# Patient Record
Sex: Male | Born: 1985 | Race: White | Hispanic: No | Marital: Married | Smoking: Never smoker
Health system: Southern US, Community
[De-identification: ages and names within clinical notes are randomized; demographics above are authoritative.]

## PROBLEM LIST (undated history)

## (undated) DIAGNOSIS — L219 Seborrheic dermatitis, unspecified: Secondary | ICD-10-CM

## (undated) DIAGNOSIS — Z91018 Allergy to other foods: Secondary | ICD-10-CM

## (undated) DIAGNOSIS — K219 Gastro-esophageal reflux disease without esophagitis: Secondary | ICD-10-CM

## (undated) DIAGNOSIS — I10 Essential (primary) hypertension: Secondary | ICD-10-CM

## (undated) DIAGNOSIS — F419 Anxiety disorder, unspecified: Secondary | ICD-10-CM

## (undated) DIAGNOSIS — M519 Unspecified thoracic, thoracolumbar and lumbosacral intervertebral disc disorder: Secondary | ICD-10-CM

## (undated) DIAGNOSIS — F329 Major depressive disorder, single episode, unspecified: Secondary | ICD-10-CM

## (undated) DIAGNOSIS — K5090 Crohn's disease, unspecified, without complications: Secondary | ICD-10-CM

## (undated) HISTORY — PX: BUNIONECTOMY: SHX129

## (undated) HISTORY — DX: Anxiety disorder, unspecified: F41.9

## (undated) HISTORY — PX: FOOT SURGERY: SHX648

---

## 2009-06-28 ENCOUNTER — Ambulatory Visit: Payer: Self-pay | Admitting: Internal Medicine

## 2009-07-09 ENCOUNTER — Ambulatory Visit: Payer: Self-pay | Admitting: Internal Medicine

## 2011-07-03 ENCOUNTER — Ambulatory Visit: Payer: Self-pay | Admitting: Internal Medicine

## 2012-04-17 ENCOUNTER — Emergency Department: Payer: Self-pay | Admitting: Emergency Medicine

## 2012-04-17 LAB — CBC WITH DIFFERENTIAL/PLATELET
Basophil %: 0.1 %
Eosinophil #: 0 10*3/uL (ref 0.0–0.7)
Eosinophil %: 0.5 %
HCT: 38.3 % — ABNORMAL LOW (ref 40.0–52.0)
HGB: 13.5 g/dL (ref 13.0–18.0)
Lymphocyte #: 3.4 10*3/uL (ref 1.0–3.6)
MCH: 32.7 pg (ref 26.0–34.0)
MCHC: 35.2 g/dL (ref 32.0–36.0)
MCV: 93 fL (ref 80–100)
Monocyte #: 0.6 x10 3/mm (ref 0.2–1.0)
Neutrophil #: 2.2 10*3/uL (ref 1.4–6.5)
Neutrophil %: 35.4 %
RBC: 4.12 10*6/uL — ABNORMAL LOW (ref 4.40–5.90)

## 2012-04-17 LAB — URINALYSIS, COMPLETE
Bacteria: NONE SEEN
Bilirubin,UR: NEGATIVE
Blood: NEGATIVE
Glucose,UR: NEGATIVE mg/dL (ref 0–75)
Ketone: NEGATIVE
Nitrite: NEGATIVE
Ph: 6 (ref 4.5–8.0)
RBC,UR: 3 /HPF (ref 0–5)
Squamous Epithelial: NONE SEEN
WBC UR: 3 /HPF (ref 0–5)

## 2012-04-17 LAB — COMPREHENSIVE METABOLIC PANEL
Anion Gap: 11 (ref 7–16)
BUN: 13 mg/dL (ref 7–18)
Bilirubin,Total: 0.4 mg/dL (ref 0.2–1.0)
Chloride: 106 mmol/L (ref 98–107)
Co2: 24 mmol/L (ref 21–32)
EGFR (African American): >60
EGFR (Non-African Amer.): >60
Potassium: 3.8 mmol/L (ref 3.5–5.1)
SGOT(AST): 24 U/L (ref 15–37)
Sodium: 141 mmol/L (ref 136–145)
Total Protein: 8.1 g/dL (ref 6.4–8.2)

## 2014-04-20 DIAGNOSIS — K5010 Crohn's disease of large intestine without complications: Secondary | ICD-10-CM | POA: Insufficient documentation

## 2014-07-14 ENCOUNTER — Ambulatory Visit: Payer: Self-pay | Admitting: Physician Assistant

## 2014-07-14 LAB — RAPID STREP-A WITH REFLX: MICRO TEXT REPORT: NEGATIVE

## 2014-07-17 LAB — BETA STREP CULTURE(ARMC)

## 2014-12-24 ENCOUNTER — Encounter: Payer: Self-pay | Admitting: Emergency Medicine

## 2014-12-24 ENCOUNTER — Emergency Department: Payer: BC Managed Care – PPO

## 2014-12-24 ENCOUNTER — Emergency Department
Admission: EM | Admit: 2014-12-24 | Discharge: 2014-12-24 | Disposition: A | Discharge status: Home / Self Care | Payer: BC Managed Care – PPO | Attending: Emergency Medicine | Admitting: Emergency Medicine

## 2014-12-24 DIAGNOSIS — R1084 Generalized abdominal pain: Secondary | ICD-10-CM | POA: Diagnosis not present

## 2014-12-24 DIAGNOSIS — R109 Unspecified abdominal pain: Secondary | ICD-10-CM | POA: Diagnosis present

## 2014-12-24 HISTORY — DX: Crohn's disease, unspecified, without complications: K50.90

## 2014-12-24 LAB — CBC WITH DIFFERENTIAL/PLATELET
Basophils Absolute: 0 10*3/uL (ref 0–0.1)
Basophils Relative: 0 %
Eosinophils Absolute: 0 10*3/uL (ref 0–0.7)
Eosinophils Relative: 0 %
HCT: 42.1 % (ref 40.0–52.0)
Hemoglobin: 14.1 g/dL (ref 13.0–18.0)
LYMPHS ABS: 1.2 10*3/uL (ref 1.0–3.6)
Lymphocytes Relative: 11 %
MCH: 31.3 pg (ref 26.0–34.0)
MCHC: 33.6 g/dL (ref 32.0–36.0)
MCV: 93.1 fL (ref 80.0–100.0)
MONOS PCT: 6 %
Monocytes Absolute: 0.6 10*3/uL (ref 0.2–1.0)
Neutro Abs: 9.5 10*3/uL — ABNORMAL HIGH (ref 1.4–6.5)
Neutrophils Relative %: 83 %
Platelets: 218 10*3/uL (ref 150–440)
RBC: 4.52 MIL/uL (ref 4.40–5.90)
RDW: 12.6 % (ref 11.5–14.5)
WBC: 11.4 10*3/uL — AB (ref 3.8–10.6)

## 2014-12-24 LAB — HEPATIC FUNCTION PANEL
ALK PHOS: 64 U/L (ref 38–126)
ALT: 38 U/L (ref 17–63)
AST: 33 U/L (ref 15–41)
Albumin: 5.1 g/dL — ABNORMAL HIGH (ref 3.5–5.0)
BILIRUBIN INDIRECT: 1 mg/dL — AB (ref 0.3–0.9)
BILIRUBIN TOTAL: 1.2 mg/dL (ref 0.3–1.2)
Bilirubin, Direct: 0.2 mg/dL (ref 0.1–0.5)
Total Protein: 8.6 g/dL — ABNORMAL HIGH (ref 6.5–8.1)

## 2014-12-24 LAB — BASIC METABOLIC PANEL
Anion gap: 11 (ref 5–15)
BUN: 19 mg/dL (ref 6–20)
CALCIUM: 9.8 mg/dL (ref 8.9–10.3)
CHLORIDE: 102 mmol/L (ref 101–111)
CO2: 22 mmol/L (ref 22–32)
Creatinine, Ser: 0.84 mg/dL (ref 0.61–1.24)
GFR calc Af Amer: >60 mL/min (ref >60)
GFR calc non Af Amer: >60 mL/min (ref >60)
Glucose, Bld: 122 mg/dL — ABNORMAL HIGH (ref 65–99)
Potassium: 3.9 mmol/L (ref 3.5–5.1)
SODIUM: 135 mmol/L (ref 135–145)

## 2014-12-24 LAB — LIPASE, BLOOD: LIPASE: 43 U/L (ref 22–51)

## 2014-12-24 MED ORDER — ONDANSETRON HCL 4 MG/2ML IJ SOLN
INTRAMUSCULAR | Status: AC
  Filled 2014-12-24: qty 2

## 2014-12-24 MED ORDER — ONDANSETRON 8 MG PO TBDP
ORAL_TABLET | ORAL | Status: AC
  Filled 2014-12-24: qty 1

## 2014-12-24 MED ORDER — ONDANSETRON HCL 4 MG/2ML IJ SOLN
4.0000 mg | Freq: Once | INTRAMUSCULAR | Status: AC
  Administered 2014-12-24: 4 mg via INTRAVENOUS

## 2014-12-24 MED ORDER — SODIUM CHLORIDE 0.9 % IV BOLUS (SEPSIS)
1000.0000 mL | Freq: Once | INTRAVENOUS | Status: AC
  Administered 2014-12-24: 1000 mL via INTRAVENOUS

## 2014-12-24 MED ORDER — ONDANSETRON 8 MG PO TBDP
8.0000 mg | ORAL_TABLET | ORAL | Status: AC
  Administered 2014-12-24: 8 mg via ORAL

## 2014-12-24 MED ORDER — HYDROMORPHONE HCL 1 MG/ML IJ SOLN
1.0000 mg | Freq: Once | INTRAMUSCULAR | Status: AC
  Administered 2014-12-24: 1 mg via INTRAVENOUS

## 2014-12-24 MED ORDER — ONDANSETRON HCL 4 MG/2ML IJ SOLN
INTRAMUSCULAR | Status: AC
  Administered 2014-12-24: 4 mg via INTRAVENOUS
  Filled 2014-12-24: qty 2

## 2014-12-24 MED ORDER — OXYCODONE-ACETAMINOPHEN 5-325 MG PO TABS: 1.0000 | ORAL_TABLET | Freq: Four times a day (QID) | ORAL | Status: DC | PRN

## 2014-12-24 MED ORDER — IOHEXOL 240 MG/ML SOLN
25.0000 mL | INTRAMUSCULAR | Status: AC
  Administered 2014-12-24: 25 mL via ORAL

## 2014-12-24 MED ORDER — HYDROMORPHONE HCL 1 MG/ML IJ SOLN
INTRAMUSCULAR | Status: AC
  Filled 2014-12-24: qty 1

## 2014-12-24 MED ORDER — IOHEXOL 300 MG/ML  SOLN
100.0000 mL | Freq: Once | INTRAMUSCULAR | Status: AC | PRN
  Administered 2014-12-24: 100 mL via INTRAVENOUS

## 2014-12-24 MED ORDER — IOHEXOL 240 MG/ML SOLN: 25.0000 mL | INTRAMUSCULAR | Status: DC

## 2014-12-24 MED ORDER — ONDANSETRON HCL 4 MG PO TABS: 4.0000 mg | ORAL_TABLET | Freq: Four times a day (QID) | ORAL | Status: DC | PRN

## 2014-12-24 NOTE — ED Provider Notes (Signed)
[facility] Emergency Department Provider Note  ____________________________________________  Time seen: 4:25 AM  I have reviewed the triage vital signs and the nursing notes.   HISTORY  Chief Complaint Abdominal Pain and Back Pain    HPI Cole Everett is a 29 y.o. male who reports. Umbilical and right lower quadrant pain for the past 4-6 hours. He has been gradually worsening and associated with lower back pain. He does have a history of Crohn's but states that this feels different. No history of kidney stones. No dysuria or hematuria. No fever or chills, but he is having nausea and had one episode of vomiting in the waiting room tonight. No prior surgeries. Pain is severe, radiating to the back, right lower quadrant, sharp, no aggravating or alleviating factors.     Past Medical History  Diagnosis Date  . Crohn disease     There are no active problems to display for this patient.   Past Surgical History  Procedure Laterality Date  . Foot surgery      Current Outpatient Rx  Name  Route  Sig  Dispense  Refill  . ibuprofen (ADVIL,MOTRIN) 800 MG tablet   Oral   Take 800 mg by mouth every 8 (eight) hours as needed for fever or mild pain.         Marland Kitchen ondansetron (ZOFRAN) 4 MG tablet   Oral   Take 1 tablet (4 mg total) by mouth every 6 (six) hours as needed for nausea or vomiting.   20 tablet   1   . oxyCODONE-acetaminophen (ROXICET) 5-325 MG per tablet   Oral   Take 1 tablet by mouth every 6 (six) hours as needed for severe pain.   12 tablet   0     Allergies Beef-derived products  History reviewed. No pertinent family history.  Social History History  Substance Use Topics  . Smoking status: Never Smoker   . Smokeless tobacco: Never Used  . Alcohol Use: Yes     Comment: 2-3 beer last night    Review of Systems  Constitutional: No fever or chills. No weight changes Eyes:No blurry vision or double vision.  ENT: No sore  throat. Cardiovascular: No chest pain. Respiratory: No dyspnea or cough. Gastrointestinal: Abdominal pain and vomiting as above.  No BRBPR or melena. Genitourinary: Negative for dysuria, urinary retention, bloody urine, or difficulty urinating. Musculoskeletal: Negative for back pain. No joint swelling or pain. Skin: Negative for rash. Neurological: Negative for headaches, focal weakness or numbness. Psychiatric:No anxiety or depression.   Endocrine:No hot/cold intolerance, changes in energy, or sleep difficulty.  10-point ROS otherwise negative.  ____________________________________________   PHYSICAL EXAM:  VITAL SIGNS: ED Triage Vitals  Enc Vitals Group     BP 12/24/14 0357 143/76 mmHg     Pulse Rate 12/24/14 0357 134     Resp 12/24/14 0357 20     Temp 12/24/14 0357 98.6 F (37 C)     Temp Source 12/24/14 0357 Oral     SpO2 12/24/14 0357 100 %     Weight 12/24/14 0357 185 lb (83.915 kg)     Height 12/24/14 0357  (1.778 m)     Head Cir --      Peak Flow --      Pain Score 12/24/14 0400 8     Pain Loc --      Pain Edu? --      Excl. in GC? --      Constitutional: Alert  and oriented. Well appearing mild distress  Eyes: No scleral icterus. No conjunctival pallor. PERRL. EOMI ENT   Head: Normocephalic and atraumatic.   Nose: No congestion/rhinnorhea. No septal hematoma   Mouth/Throat: MMM, no pharyngeal erythema. No peritonsillar mass. No uvula shift.   Neck: No stridor. No SubQ emphysema. No meningismus. Hematological/Lymphatic/Immunilogical: No cervical lymphadenopathy. Cardiovascular: Tachycardia rate 120. Normal and symmetric distal pulses are present in all extremities. No murmurs, rubs, or gallops. Respiratory: Normal respiratory effort without tachypnea nor retractions. Breath sounds are clear and equal bilaterally. No wheezes/rales/rhonchi. Gastrointestinal: Right lower quadrant and left lower quadrant tenderness.. No distention. There is no  CVA tenderness.  No rebound, rigidity, or guarding. Genitourinary: deferred Musculoskeletal: Nontender with normal range of motion in all extremities. No joint effusions.  No lower extremity tenderness.  No edema. Neurologic:   Normal speech and language.  CN 2-10 normal. Motor grossly intact. No pronator drift.  Normal gait. No gross focal neurologic deficits are appreciated.  Skin:  Skin is warm, dry and intact. No rash noted.  No petechiae, purpura, or bullae. Psychiatric: Mood and affect are normal. Speech and behavior are normal. Patient exhibits appropriate insight and judgment.  ____________________________________________    LABS (pertinent positives/negatives) (all labs ordered are listed, but only abnormal results are displayed) Labs Reviewed  BASIC METABOLIC PANEL - Abnormal; Notable for the following:    Glucose, Bld 122 (*)    All other components within normal limits  HEPATIC FUNCTION PANEL - Abnormal; Notable for the following:    Total Protein 8.6 (*)    Albumin 5.1 (*)    Indirect Bilirubin 1.0 (*)    All other components within normal limits  CBC WITH DIFFERENTIAL/PLATELET - Abnormal; Notable for the following:    WBC 11.4 (*)    Neutro Abs 9.5 (*)    All other components within normal limits  LIPASE, BLOOD  URINALYSIS COMPLETEWITH MICROSCOPIC ([facility] ONLY)   ____________________________________________   EKG    ____________________________________________    RADIOLOGY  CT abdomen and pelvis reviewed, unremarkable  ____________________________________________   PROCEDURES  ____________________________________________   INITIAL IMPRESSION / ASSESSMENT AND PLAN / ED COURSE  Pertinent labs & imaging results that were available during my care of the patient were reviewed by me and considered in my medical decision making (see chart for details).  Concern for appendicitis. IV fluids, IV Dilaudid and Zofran, labs, CT abdomen and  pelvis. ----------------------------------------- 7:10 AM on 12/24/2014 -----------------------------------------  Pain controlled, nausea improved with medication. Tolerating oral intake. Workup unremarkable. No appendicitis perforation kidney stone or Crohn's flare. We'll treat symptomatically with Zofran and Percocet when necessary and have him follow-up with his primary care doctor. ____________________________________________   FINAL CLINICAL IMPRESSION(S) / ED DIAGNOSES  Final diagnoses:  Generalized abdominal pain      Sharman CheekPhillip Deklen Popelka, MD 12/24/14 [phone]

## 2014-12-24 NOTE — Discharge Instructions (Signed)
Abdominal Pain °Many things can cause abdominal pain. Usually, abdominal pain is not caused by a disease and will improve without treatment. It can often be observed and treated at home. Your health care provider will do a physical exam and possibly order blood tests and X-rays to help determine the seriousness of your pain. However, in many cases, more time must pass before a clear cause of the pain can be found. Before that point, your health care provider may not know if you need more testing or further treatment. °HOME CARE INSTRUCTIONS  °Monitor your abdominal pain for any changes. The following actions may help to alleviate any discomfort you are experiencing: °· Only take over-the-counter or prescription medicines as directed by your health care provider. °· Do not take laxatives unless directed to do so by your health care provider. °· Try a clear liquid diet (broth, tea, or water) as directed by your health care provider. Slowly move to a bland diet as tolerated. °SEEK MEDICAL CARE IF: °· You have unexplained abdominal pain. °· You have abdominal pain associated with nausea or diarrhea. °· You have pain when you urinate or have a bowel movement. °· You experience abdominal pain that wakes you in the night. °· You have abdominal pain that is worsened or improved by eating food. °· You have abdominal pain that is worsened with eating fatty foods. °· You have a fever. °SEEK IMMEDIATE MEDICAL CARE IF:  °· Your pain does not go away within 2 hours. °· You keep throwing up (vomiting). °· Your pain is felt only in portions of the abdomen, such as the right side or the left lower portion of the abdomen. °· You pass bloody or black tarry stools. °MAKE SURE YOU: °· Understand these instructions.   °· Will watch your condition.   °· Will get help right away if you are not doing well or get worse.   °Document Released: 04/15/2005 Document Revised: 07/11/2013 Document Reviewed: 03/15/2013 °Exit[employer]® Patient Information  ©2015 [employer]. This information is not intended to replace advice given to you by your health care provider. Make sure you discuss any questions you have with your health care provider. ° °You were prescribed a medication that is potentially sedating. Do not drink alcohol, drive or participate in any other potentially dangerous activities while taking this medication as it may make you sleepy. Do not take this medication with any other sedating medications, either prescription or over-the-counter. If you were prescribed Percocet or Vicodin, do not take these with acetaminophen (Tylenol) as it is already contained within these medications. °  °Opioid pain medications (or "narcotics") can be habit forming.  Use it as little as possible to achieve adequate pain control.  Do not use or use it with extreme caution if you have a history of opiate abuse or dependence.  If you are on a pain contract with your primary care doctor or a pain specialist, be sure to let them know you were prescribed this medication today from the [facility].  This medication is intended for your use only - do not give any to anyone else and keep it in a secure place where nobody else, especially children and pets, have access to it.  It will also cause or worsen constipation, so you may want to consider taking an over-the-counter stool softener while you are taking this medication. ° °

## 2014-12-24 NOTE — ED Notes (Signed)
Pt c/o pain to umbilicus area since 2 am; burning, sharp pain; nausea, no vomiting; also having pain across lower back; pt with history of Crohns

## 2015-06-20 DIAGNOSIS — M519 Unspecified thoracic, thoracolumbar and lumbosacral intervertebral disc disorder: Secondary | ICD-10-CM | POA: Insufficient documentation

## 2015-06-20 DIAGNOSIS — K2100 Gastro-esophageal reflux disease with esophagitis, without bleeding: Secondary | ICD-10-CM | POA: Insufficient documentation

## 2016-08-08 ENCOUNTER — Encounter: Payer: Self-pay | Admitting: Gynecology

## 2016-08-08 ENCOUNTER — Ambulatory Visit
Admission: EM | Admit: 2016-08-08 | Discharge: 2016-08-08 | Disposition: A | Discharge status: Home / Self Care | Payer: BC Managed Care – PPO | Attending: Family Medicine | Admitting: Family Medicine

## 2016-08-08 DIAGNOSIS — K50911 Crohn's disease, unspecified, with rectal bleeding: Secondary | ICD-10-CM

## 2016-08-08 DIAGNOSIS — F3289 Other specified depressive episodes: Secondary | ICD-10-CM | POA: Diagnosis not present

## 2016-08-08 DIAGNOSIS — R1030 Lower abdominal pain, unspecified: Secondary | ICD-10-CM

## 2016-08-08 HISTORY — DX: Major depressive disorder, single episode, unspecified: F32.9

## 2016-08-08 HISTORY — DX: Unspecified thoracic, thoracolumbar and lumbosacral intervertebral disc disorder: M51.9

## 2016-08-08 HISTORY — DX: Gastro-esophageal reflux disease without esophagitis: K21.9

## 2016-08-08 HISTORY — DX: Essential (primary) hypertension: I10

## 2016-08-08 HISTORY — DX: Seborrheic dermatitis, unspecified: L21.9

## 2016-08-08 MED ORDER — PREDNISONE 10 MG (21) PO TBPK: 10.0000 mg | ORAL_TABLET | Freq: Every day | ORAL | 0 refills | Status: DC

## 2016-08-08 NOTE — ED Triage Notes (Signed)
Per patient chrons pain, stress and depression x few days.

## 2016-08-08 NOTE — ED Provider Notes (Signed)
CSN: [id number]     Arrival date & time 08/08/16  0844 History   First MD Initiated Contact with Patient 08/08/16 1041     Chief Complaint  Patient presents with  . Stress  . Depression   (Consider location/radiation/quality/duration/timing/severity/associated sxs/prior Treatment) HPI: 31 yo male with H/O Chron's, Anxiety, depression, HTN presented to clinic with CC of cramping, sharp abdominal pain on and off with 1-2 bloody Bm/day. Reports Anxiety/Depression medication is not working for him. Unable to fall asleep and focus. Increased stressor at work. Denies SI/HI.  Past Medical History:  Diagnosis Date  . Crohn disease (HCC)   . Depression   . GERD (gastroesophageal reflux disease)   . Hypertension   . Lumbar disc disease   . Seborrheic dermatitis    Past Surgical History:  Procedure Laterality Date  . BUNIONECTOMY    . FOOT SURGERY     No family history on file. Social History  Substance Use Topics  . Smoking status: Never Smoker  . Smokeless tobacco: Never Used  . Alcohol use Yes     Comment: 2-3 beer last night    Review of Systems  Gastrointestinal: Positive for abdominal pain and blood in stool.  Psychiatric/Behavioral: Positive for behavioral problems and sleep disturbance. Negative for hallucinations and suicidal ideas. The patient is nervous/anxious. The patient is not hyperactive.     Allergies  Beef-derived products and Penicillins  Home Medications   Prior to Admission medications   Medication Sig Start Date End Date Taking? Authorizing Provider  buPROPion (WELLBUTRIN XL) 150 MG 24 hr tablet Take 150 mg by mouth daily.   Yes Historical Provider, MD  cimetidine (TAGAMET) 400 MG tablet Take 400 mg by mouth 2 (two) times daily.   Yes Historical Provider, MD  hydrochlorothiazide (MICROZIDE) 12.5 MG capsule Take 12.5 mg by mouth daily.   Yes Historical Provider, MD  polyethylene glycol (MIRALAX / GLYCOLAX) packet Take 17 g by mouth daily.   Yes Historical  Provider, MD  ibuprofen (ADVIL,MOTRIN) 800 MG tablet Take 800 mg by mouth every 8 (eight) hours as needed for fever or mild pain.    Historical Provider, MD  ondansetron (ZOFRAN) 4 MG tablet Take 1 tablet (4 mg total) by mouth every 6 (six) hours as needed for nausea or vomiting. 12/24/14   Sharman Cheek, MD  oxyCODONE-acetaminophen (ROXICET) 5-325 MG per tablet Take 1 tablet by mouth every 6 (six) hours as needed for severe pain. 12/24/14   Sharman Cheek, MD  predniSONE (STERAPRED UNI-PAK 21 TAB) 10 MG (21) TBPK tablet Take 1 tablet (10 mg total) by mouth daily. Start 4 tabs PO day 1, then 3 tabs on day 2, then 2 tabs day 3, and 1 tab day 4 08/08/16   Jolana Runkles, NP   Meds Ordered and Administered this Visit  Medications - No data to display  BP (!) 149/85 (BP Location: Left Arm)   Pulse 97   Temp 98.1 F (36.7 C) (Oral)   Resp 16   Ht 5\' 10"  (1.778 m)   Wt 186 lb (84.4 kg)   SpO2 100%   BMI 26.69 kg/m  No data found.   Physical Exam  Constitutional: He is oriented to person, place, and time. He appears well-developed and well-nourished. No distress.  Cardiovascular: Normal rate and regular rhythm.   Pulmonary/Chest: Effort normal and breath sounds normal.  Abdominal: Soft. There is tenderness. There is no rebound and no guarding.  Neurological: He is alert and oriented to person, place,  and time.  Skin: Skin is warm.  Psychiatric: His behavior is normal. Judgment and thought content normal.  Pt is anxious, concerns for increased stressor at work. Denies SI/HI Lower abdominal tenderness to palpation. No guarding/rebound. Urgent Care Course     Procedures (including critical care time)  Labs Review Labs Reviewed - No data to display  Imaging Review No results found.   Visual Acuity Review  Right Eye Distance:   Left Eye Distance:   Bilateral Distance:    Right Eye Near:   Left Eye Near:    Bilateral Near:         MDM   1. Lower abdominal pain   2.  Exacerbation of Crohn's disease with rectal bleeding (HCC)   3. Other depression   Sx likely from Crohn's Exacerbation. Will put on tapering dose of prednisone. Continue Wellbutrin and other prescribed medications on regular basis.  Pt has appointment with Psychiatrist on Monday and with GI on Wednesday. Pt encouraged to keep both appointments for further evaluation and recommendations. If Sx escalates advised to go to ED or call 911.     Hosey Burmester, NP 08/08/16 1059    Monroe Toure, NP 08/08/16 1101

## 2018-09-21 DIAGNOSIS — M545 Low back pain: Secondary | ICD-10-CM | POA: Diagnosis not present

## 2018-12-15 ENCOUNTER — Ambulatory Visit
Admission: EM | Admit: 2018-12-15 | Discharge: 2018-12-15 | Disposition: A | Discharge status: Home / Self Care | Payer: BLUE CROSS/BLUE SHIELD

## 2018-12-15 ENCOUNTER — Other Ambulatory Visit: Payer: Self-pay

## 2018-12-15 DIAGNOSIS — R21 Rash and other nonspecific skin eruption: Secondary | ICD-10-CM

## 2018-12-15 DIAGNOSIS — W57XXXA Bitten or stung by nonvenomous insect and other nonvenomous arthropods, initial encounter: Secondary | ICD-10-CM

## 2018-12-15 HISTORY — DX: Allergy to other foods: Z91.018

## 2018-12-15 MED ORDER — DOXYCYCLINE HYCLATE 100 MG PO CAPS: 100.0000 mg | ORAL_CAPSULE | Freq: Two times a day (BID) | ORAL | 0 refills | Status: DC

## 2018-12-15 NOTE — Discharge Instructions (Signed)
It was very nice meeting you today in clinic. Thank you for entrusting me with your care.   As discussed, please utilize the medications that we discussed. Your prescriptions have been called in to your pharmacy. Monitor for increasing rash, redness, pain, swelling, drainage, and fever.   Make arrangements to follow up with your regular doctor in 1 week for re-evaluation. If your symptoms/condition worsens, please seek follow up care either here or in the ER. Please remember, our [facility] providers are "right here with you" when you need [place].   Again, it was my pleasure to take care of you today. Thank you for choosing our clinic. I hope that you start to feel better quickly.   Quentin Mulling, MSN, APRN, FNP-C, CEN Advanced Practice Provider [facility]

## 2018-12-15 NOTE — ED Triage Notes (Signed)
Patient complains of a tick bite to his left thigh. Patient states that he was bitten by a lonestar tick and has noticed swelling. 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 Patient states that swelling has been worsening.

## 2018-12-15 NOTE — ED Provider Notes (Signed)
[address] [place], [place] [address] [phone]   Name: Cole Everett DOB: 07-15-1986 MRN: [id number] CSN: [id number] PCP: Tommy Medal, MD  Arrival date and time:  12/15/18 1349  Chief Complaint:  Tick Removal  NOTE: Prior to seeing the patient today, I have reviewed the triage nursing documentation and vital signs. Clinical staff has updated patient's PMH/PSHx, current medication list, and drug allergies/intolerances to ensure comprehensive history available to assist in medical decision making.   History:   HPI: Cole Everett is a 33 y.o. male who presents today with complaints of multiple tick bites sustained on 12/10/2018.  Patient notes that he was outside over the weekend.  When he came into the shower, patient appreciated several embedded ticks to various parts of his body.  Patient with bites to LEFT shoulder, RIGHT axilla, upper abdomen, and LEFT inner thigh.  Bite to thigh with associated rash ascending into the groin.  Patient complains of inguinal lymphadenopathy and tenderness.  No fevers, headaches, myalgias, or arthralgias.  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  Past Medical History:  Diagnosis Date  . Allergy to alpha-gal   . Crohn disease ([place])   . Depression   . GERD (gastroesophageal reflux disease)   . Hypertension   . Lumbar disc disease   . Seborrheic dermatitis     Past Surgical History:  Procedure Laterality Date  . BUNIONECTOMY    . FOOT SURGERY      Family History  Problem Relation Age of Onset  . Irritable bowel syndrome Mother   . Hypertension Mother   . Heart disease Mother   . Cancer Father   . Cirrhosis Father     Social History   Socioeconomic History  . Marital status: Married    Spouse name: Not on file  . Number of children: Not on file  . Years of education: Not on file  . Highest education level: Not on file   Occupational History  . Not on file  Social Needs  . Financial resource strain: Not on file  . Food insecurity:    Worry: Not on file    Inability: Not on file  . Transportation needs:    Medical: Not on file    Non-medical: Not on file  Tobacco Use  . Smoking status: Never Smoker  . Smokeless tobacco: Never Used  Substance and Sexual Activity  . Alcohol use: Yes    Comment: occasionally  . Drug use: No  . Sexual activity: Not on file  Lifestyle  . Physical activity:    Days per week: Not on file    Minutes per session: Not on file  . Stress: Not on file  Relationships  . Social connections:    Talks on phone: Not on file    Gets together: Not on file    Attends religious service: Not on file    Active member of club or organization: Not on file    Attends meetings of clubs or organizations: Not on file    Relationship status: Not on file  . Intimate partner violence:    Fear of current or ex partner: Not on file    Emotionally abused: Not on file    Physically abused: Not on file    Forced sexual activity: Not on file  Other Topics Concern  . Not on file  Social History Narrative  . Not on  file    There are no active problems to display for this patient.   Home Medications:    Current Meds  Medication Sig  . CIMZIA PREFILLED 2 X 200 MG/ML KIT     Allergies:   Beef-derived products and Penicillins  Review of Systems (ROS): Review of Systems  Constitutional: Negative for chills and fever.  Respiratory: Negative for cough and shortness of breath.   Cardiovascular: Negative for chest pain and palpitations.  Gastrointestinal: Negative for nausea and vomiting.  Musculoskeletal: Negative for arthralgias, joint swelling and myalgias.  Skin:       Multiple tick bites  Neurological: Negative for dizziness and headaches.  Hematological: Positive for adenopathy (LEFT inguinal area).     Physical Exam:  Triage Vital Signs ED Triage Vitals  Enc Vitals Group      BP 12/15/18 1406 (!) 141/97     Pulse Rate 12/15/18 1406 94     Resp 12/15/18 1406 16     Temp 12/15/18 1406 98.4 F (36.9 C)     Temp Source 12/15/18 1406 Oral     SpO2 12/15/18 1406 99 %     Weight 12/15/18 1403 190 lb (86.2 kg)     Height 12/15/18 1403 5' 10"  (1.778 m)     Head Circumference --      Peak Flow --      Pain Score 12/15/18 1403 6     Pain Loc --      Pain Edu? --      Excl. in [place]? --     Physical Exam  Constitutional: He is oriented to person, place, and time and well-developed, well-nourished, and in no distress.  HENT:  Head: Normocephalic and atraumatic.  Mouth/Throat: Mucous membranes are normal.  Neck: Normal range of motion. Neck supple. No tracheal deviation present.  Cardiovascular: Normal rate, regular rhythm, normal heart sounds and intact distal pulses. Exam reveals no gallop and no friction rub.  No murmur heard. Pulmonary/Chest: Effort normal and breath sounds normal. No respiratory distress. He has no wheezes. He has no rales.  Lymphadenopathy:    He has no cervical adenopathy.       Left: Inguinal (tender LAD noted in line with lymphangitic spread) adenopathy present.  Neurological: He is alert and oriented to person, place, and time.  Skin: Skin is warm and dry.  Multiple tick bites: LEFT shoulder, RIGHT axilla, upper abdomen, and LEFT inner thigh.  (+) per-bite erythema notes with (+) lymphangitic spread into LEFT groin area.   Psychiatric: Mood, affect and judgment normal.  Nursing note and vitals reviewed.    Urgent Care Treatments / Results:   LABS: PLEASE NOTE: all labs that were ordered this encounter are listed, however only abnormal results are displayed. Labs Reviewed - No data to display  EKG: -None  RADIOLOGY: No results found.  PRODEDURES: Procedures  MEDICATIONS RECEIVED THIS VISIT: Medications - No data to display  PERTINENT CLINICAL COURSE NOTES/UPDATES: No data to display   Initial Impression / Assessment  and Plan / Urgent Care Course:    Cole Everett is a 33 y.o. male who presents to [facility] today with complaints of Tick Removal  Pertinent labs & imaging results that were available during my care of the patient were personally reviewed by me and considered in my medical decision making (see lab/imaging section of note for values and interpretations).  Exam reveals multiple insect bites.  Bite to LEFT thigh with associated a sending rash that terminates  in the groin area.  There is positive tender LAD noted.  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  Patient denies any associated fevers, headaches, arthralgias, or myalgias.  Patient notes that at least 1 of the tics was a [place] tick.  Discussed that Lonestar tick not generally associated with Lyme disease, however there are various tickborne illnesses such as ehrlichiosis that can result from tick bites.  Given patient's exam, number of bites, and past medical history, will proceed with prophylactic treatment with a 10-day course of doxycycline.  Patient encouraged to monitor symptoms.  Educated on concerning signs which would include increased redness, drainage, rash progression, and development of fever.  Patient may use APAP and/or ibuprofen as needed for conservative treatment.   Discussed follow up with primary care physician in 1 week for re-evaluation. I have reviewed the follow up and strict return precautions for any new or worsening symptoms. Patient is aware of symptoms that would be deemed urgent/emergent, and would thus require further evaluation either here or in the emergency department. At the time of discharge, he verbalized understanding and consent with the discharge plan as it was reviewed with him. All questions were fielded by provider and/or clinic staff prior to patient discharge.    Final Clinical Impressions(s) / Urgent Care Diagnoses:   Final diagnoses:  Tick bite, initial encounter    New  Prescriptions:   Meds ordered this encounter  Medications  . doxycycline (VIBRAMYCIN) 100 MG capsule    Sig: Take 1 capsule (100 mg total) by mouth 2 (two) times daily.    Dispense:  20 capsule    Refill:  0    Controlled Substance Prescriptions:  [place] Controlled Substance Registry consulted? Not Applicable  NOTE: This note was prepared using Dragon dictation software along with smaller phrase technology. Despite my best ability to proofread, there is the potential that transcriptional errors may still occur from this process, and are completely unintentional.     Karen Kitchens, NP 12/15/18 1435

## 2019-04-18 DIAGNOSIS — D225 Melanocytic nevi of trunk: Secondary | ICD-10-CM | POA: Diagnosis not present

## 2019-04-18 DIAGNOSIS — D485 Neoplasm of uncertain behavior of skin: Secondary | ICD-10-CM | POA: Diagnosis not present

## 2019-04-18 DIAGNOSIS — L400 Psoriasis vulgaris: Secondary | ICD-10-CM | POA: Diagnosis not present

## 2019-04-18 DIAGNOSIS — D2372 Other benign neoplasm of skin of left lower limb, including hip: Secondary | ICD-10-CM | POA: Diagnosis not present

## 2019-06-05 ENCOUNTER — Ambulatory Visit
Admission: EM | Admit: 2019-06-05 | Discharge: 2019-06-05 | Disposition: A | Discharge status: Home / Self Care | Payer: BC Managed Care – PPO | Attending: Family Medicine | Admitting: Family Medicine

## 2019-06-05 ENCOUNTER — Other Ambulatory Visit: Payer: Self-pay

## 2019-06-05 DIAGNOSIS — S39012A Strain of muscle, fascia and tendon of lower back, initial encounter: Secondary | ICD-10-CM | POA: Diagnosis not present

## 2019-06-05 DIAGNOSIS — R1031 Right lower quadrant pain: Secondary | ICD-10-CM

## 2019-06-05 DIAGNOSIS — Y999 Unspecified external cause status: Secondary | ICD-10-CM

## 2019-06-05 LAB — URINALYSIS, COMPLETE (UACMP) WITH MICROSCOPIC
Bacteria, UA: NONE SEEN
Bilirubin Urine: NEGATIVE
Glucose, UA: NEGATIVE mg/dL
Hgb urine dipstick: NEGATIVE
Ketones, ur: NEGATIVE mg/dL
Leukocytes,Ua: NEGATIVE
Nitrite: NEGATIVE
Protein, ur: NEGATIVE mg/dL
RBC / HPF: NONE SEEN RBC/hpf (ref 0–5)
Specific Gravity, Urine: 1.015 (ref 1.005–1.030)
Squamous Epithelial / LPF: NONE SEEN (ref 0–5)
WBC, UA: NONE SEEN WBC/hpf (ref 0–5)
pH: 7 (ref 5.0–8.0)

## 2019-06-05 MED ORDER — CYCLOBENZAPRINE HCL 10 MG PO TABS: 10.0000 mg | ORAL_TABLET | Freq: Every day | ORAL | 0 refills | Status: DC

## 2019-06-05 NOTE — Discharge Instructions (Signed)
Rest, heat, easy low back stretches, over the counter ibuprofen and tylenol as needed

## 2019-06-05 NOTE — ED Triage Notes (Signed)
10 days of right flank pain now going into right groin. Pain 6/10. No nausea, no trouble urinating

## 2019-06-05 NOTE — ED Provider Notes (Signed)
[facility]    CSN: [id number] Arrival date & time: 06/05/19  1432      History   Chief Complaint Chief Complaint  Patient presents with  . Flank Pain    HPI Cole Everett is a 33 y.o. male.   33 yo male with a c/o right lower back pain for 10 days. Denies any falls or traumatic injury, fevers, chills, numbness/tingling, nausea, vomiting, diarrhea, urinary symptoms. States yesterday he felt the pain radiating down towards the right groin area. States he has had some constipation. States low back pain is worse with certain positions or movements and relieved last night with pillow lumbar support.    Flank Pain    Past Medical History:  Diagnosis Date  . Allergy to alpha-gal   . Crohn disease ([place])   . Depression   . GERD (gastroesophageal reflux disease)   . Hypertension   . Lumbar disc disease   . Seborrheic dermatitis     There are no active problems to display for this patient.   Past Surgical History:  Procedure Laterality Date  . BUNIONECTOMY    . FOOT SURGERY         Home Medications    Prior to Admission medications   Medication Sig Start Date End Date Taking? Authorizing Provider  CIMZIA PREFILLED 2 X 200 MG/ML KIT  10/03/18   [provider]  cyclobenzaprine (FLEXERIL) 10 MG tablet Take 1 tablet (10 mg total) by mouth at bedtime. 06/05/19   Norval Gable, MD  doxycycline (VIBRAMYCIN) 100 MG capsule Take 1 capsule (100 mg total) by mouth 2 (two) times daily. 12/15/18   Karen Kitchens, NP    Family History Family History  Problem Relation Age of Onset  . Irritable bowel syndrome Mother   . Hypertension Mother   . Heart disease Mother   . Cancer Father   . Cirrhosis Father     Social History Social History   Tobacco Use  . Smoking status: Never Smoker  . Smokeless tobacco: Never Used  Substance Use Topics  . Alcohol use: Yes    Comment: occasionally  . Drug use: Yes    Types: Marijuana    Comment: last use last  night     Allergies   Beef-derived products and Penicillins   Review of Systems Review of Systems  Genitourinary: Positive for flank pain.     Physical Exam Triage Vital Signs ED Triage Vitals  Enc Vitals Group     BP 06/05/19 1446 (!) 151/91     Pulse Rate 06/05/19 1446 82     Resp 06/05/19 1446 16     Temp 06/05/19 1446 98.3 F (36.8 C)     Temp Source 06/05/19 1446 Oral     SpO2 06/05/19 1446 100 %     Weight 06/05/19 1447 195 lb (88.5 kg)     Height 06/05/19 1447 _0  (1.778 m)     Head Circumference --      Peak Flow --      Pain Score 06/05/19 1446 6     Pain Loc --      Pain Edu? --      Excl. in [place]? --    No data found.  Updated Vital Signs BP (!) 151/91 (BP Location: Right Arm)   Pulse 82   Temp 98.3 F (36.8 C) (Oral)   Resp 16   Ht _1  (1.778 m)   Wt 88.5 kg   SpO2 100%  BMI 27.98 kg/m   Visual Acuity Right Eye Distance:   Left Eye Distance:   Bilateral Distance:    Right Eye Near:   Left Eye Near:    Bilateral Near:     Physical Exam Vitals signs and nursing note reviewed.  Constitutional:      General: He is not in acute distress.    Appearance: He is not toxic-appearing or diaphoretic.  Cardiovascular:     Rate and Rhythm: Normal rate.  Pulmonary:     Effort: Pulmonary effort is normal. No respiratory distress.  Abdominal:     General: Bowel sounds are normal. There is no distension.     Palpations: Abdomen is soft. There is no mass.     Tenderness: There is no abdominal tenderness. There is no right CVA tenderness, left CVA tenderness, guarding or rebound.     Hernia: No hernia is present. There is no hernia in the right inguinal area.  Genitourinary:    Pubic Area: No rash.      Penis: Normal.      Scrotum/Testes: Normal.     Epididymis:     Right: Normal.  Musculoskeletal:     Lumbar back: He exhibits tenderness (over the right lower paraspinous muscles) and spasm. He exhibits no bony tenderness, no swelling, no  edema, no deformity, no laceration and normal pulse.  Lymphadenopathy:     Lower Body: No right inguinal adenopathy.  Neurological:     Mental Status: He is alert.      UC Treatments / Results  Labs (all labs ordered are listed, but only abnormal results are displayed) Labs Reviewed  URINALYSIS, COMPLETE (UACMP) WITH MICROSCOPIC    EKG   Radiology No results found.  Procedures Procedures (including critical care time)  Medications Ordered in UC Medications - No data to display  Initial Impression / Assessment and Plan / UC Course  I have reviewed the triage vital signs and the nursing notes.  Pertinent labs & imaging results that were available during my care of the patient were reviewed by me and considered in my medical decision making (see chart for details).      Final Clinical Impressions(s) / UC Diagnoses   Final diagnoses:  Strain of lumbar region, initial encounter     Discharge Instructions     Rest, heat, easy low back stretches, over the counter ibuprofen and tylenol as needed    ED Prescriptions    Medication Sig Dispense Auth. Provider   cyclobenzaprine (FLEXERIL) 10 MG tablet Take 1 tablet (10 mg total) by mouth at bedtime. 15 tablet Norval Gable, MD      1. Lab results and diagnosis reviewed with patient 2. rx as per orders above; reviewed possible side effects, interactions, risks and benefits  3. Recommend supportive treatment as above 4. Follow-up prn if symptoms worsen or don't improve  PDMP not reviewed this encounter.   Norval Gable, MD 06/05/19 [phone]

## 2019-07-06 ENCOUNTER — Other Ambulatory Visit: Payer: Self-pay | Admitting: General Practice

## 2019-07-06 ENCOUNTER — Ambulatory Visit
Admission: RE | Admit: 2019-07-06 | Discharge: 2019-07-06 | Disposition: A | Discharge status: Home / Self Care | Payer: BC Managed Care – PPO | Source: Ambulatory Visit | Attending: General Practice | Admitting: General Practice

## 2019-07-06 DIAGNOSIS — M546 Pain in thoracic spine: Secondary | ICD-10-CM

## 2019-07-06 DIAGNOSIS — M545 Low back pain, unspecified: Secondary | ICD-10-CM

## 2019-07-24 DIAGNOSIS — M5416 Radiculopathy, lumbar region: Secondary | ICD-10-CM | POA: Diagnosis not present

## 2019-07-28 DIAGNOSIS — M546 Pain in thoracic spine: Secondary | ICD-10-CM | POA: Diagnosis not present

## 2019-07-28 DIAGNOSIS — M545 Low back pain: Secondary | ICD-10-CM | POA: Diagnosis not present

## 2019-08-02 DIAGNOSIS — M545 Low back pain: Secondary | ICD-10-CM | POA: Diagnosis not present

## 2019-08-02 DIAGNOSIS — M546 Pain in thoracic spine: Secondary | ICD-10-CM | POA: Diagnosis not present

## 2019-08-04 DIAGNOSIS — M546 Pain in thoracic spine: Secondary | ICD-10-CM | POA: Diagnosis not present

## 2019-08-04 DIAGNOSIS — M545 Low back pain: Secondary | ICD-10-CM | POA: Diagnosis not present

## 2019-08-06 DIAGNOSIS — Z20822 Contact with and (suspected) exposure to covid-19: Secondary | ICD-10-CM | POA: Diagnosis not present

## 2019-08-06 DIAGNOSIS — Z01812 Encounter for preprocedural laboratory examination: Secondary | ICD-10-CM | POA: Diagnosis not present

## 2019-08-07 DIAGNOSIS — M545 Low back pain: Secondary | ICD-10-CM | POA: Diagnosis not present

## 2019-08-07 DIAGNOSIS — M546 Pain in thoracic spine: Secondary | ICD-10-CM | POA: Diagnosis not present

## 2019-08-09 DIAGNOSIS — K50919 Crohn's disease, unspecified, with unspecified complications: Secondary | ICD-10-CM | POA: Diagnosis not present

## 2019-08-09 DIAGNOSIS — K5010 Crohn's disease of large intestine without complications: Secondary | ICD-10-CM | POA: Diagnosis not present

## 2019-08-09 DIAGNOSIS — K648 Other hemorrhoids: Secondary | ICD-10-CM | POA: Diagnosis not present

## 2019-08-11 DIAGNOSIS — M546 Pain in thoracic spine: Secondary | ICD-10-CM | POA: Diagnosis not present

## 2019-08-11 DIAGNOSIS — M545 Low back pain: Secondary | ICD-10-CM | POA: Diagnosis not present

## 2019-08-16 DIAGNOSIS — M546 Pain in thoracic spine: Secondary | ICD-10-CM | POA: Diagnosis not present

## 2019-08-16 DIAGNOSIS — M545 Low back pain: Secondary | ICD-10-CM | POA: Diagnosis not present

## 2019-08-24 DIAGNOSIS — M546 Pain in thoracic spine: Secondary | ICD-10-CM | POA: Diagnosis not present

## 2019-08-24 DIAGNOSIS — M545 Low back pain: Secondary | ICD-10-CM | POA: Diagnosis not present

## 2019-08-28 DIAGNOSIS — M546 Pain in thoracic spine: Secondary | ICD-10-CM | POA: Diagnosis not present

## 2019-08-28 DIAGNOSIS — M545 Low back pain: Secondary | ICD-10-CM | POA: Diagnosis not present

## 2019-09-06 DIAGNOSIS — M545 Low back pain: Secondary | ICD-10-CM | POA: Diagnosis not present

## 2019-09-06 DIAGNOSIS — M546 Pain in thoracic spine: Secondary | ICD-10-CM | POA: Diagnosis not present

## 2019-09-14 DIAGNOSIS — M5416 Radiculopathy, lumbar region: Secondary | ICD-10-CM | POA: Diagnosis not present

## 2019-09-18 DIAGNOSIS — M545 Low back pain: Secondary | ICD-10-CM | POA: Diagnosis not present

## 2019-09-18 DIAGNOSIS — M546 Pain in thoracic spine: Secondary | ICD-10-CM | POA: Diagnosis not present

## 2020-01-19 DIAGNOSIS — F331 Major depressive disorder, recurrent, moderate: Secondary | ICD-10-CM | POA: Diagnosis not present

## 2020-01-19 DIAGNOSIS — F419 Anxiety disorder, unspecified: Secondary | ICD-10-CM | POA: Diagnosis not present

## 2020-01-31 DIAGNOSIS — F4310 Post-traumatic stress disorder, unspecified: Secondary | ICD-10-CM | POA: Diagnosis not present

## 2020-02-12 DIAGNOSIS — F419 Anxiety disorder, unspecified: Secondary | ICD-10-CM | POA: Diagnosis not present

## 2020-02-12 DIAGNOSIS — F331 Major depressive disorder, recurrent, moderate: Secondary | ICD-10-CM | POA: Diagnosis not present

## 2020-02-14 DIAGNOSIS — F4310 Post-traumatic stress disorder, unspecified: Secondary | ICD-10-CM | POA: Diagnosis not present

## 2020-03-19 DIAGNOSIS — F4310 Post-traumatic stress disorder, unspecified: Secondary | ICD-10-CM | POA: Diagnosis not present

## 2020-04-01 DIAGNOSIS — F4310 Post-traumatic stress disorder, unspecified: Secondary | ICD-10-CM | POA: Diagnosis not present

## 2020-04-03 DIAGNOSIS — S93402A Sprain of unspecified ligament of left ankle, initial encounter: Secondary | ICD-10-CM | POA: Diagnosis not present

## 2020-04-08 DIAGNOSIS — F4310 Post-traumatic stress disorder, unspecified: Secondary | ICD-10-CM | POA: Diagnosis not present

## 2020-04-09 DIAGNOSIS — F4310 Post-traumatic stress disorder, unspecified: Secondary | ICD-10-CM | POA: Diagnosis not present

## 2020-04-09 DIAGNOSIS — F331 Major depressive disorder, recurrent, moderate: Secondary | ICD-10-CM | POA: Diagnosis not present

## 2020-04-09 DIAGNOSIS — F419 Anxiety disorder, unspecified: Secondary | ICD-10-CM | POA: Diagnosis not present

## 2020-04-15 DIAGNOSIS — F4310 Post-traumatic stress disorder, unspecified: Secondary | ICD-10-CM | POA: Diagnosis not present

## 2020-04-15 DIAGNOSIS — K50919 Crohn's disease, unspecified, with unspecified complications: Secondary | ICD-10-CM | POA: Diagnosis not present

## 2020-04-16 DIAGNOSIS — D2261 Melanocytic nevi of right upper limb, including shoulder: Secondary | ICD-10-CM | POA: Diagnosis not present

## 2020-04-16 DIAGNOSIS — D2262 Melanocytic nevi of left upper limb, including shoulder: Secondary | ICD-10-CM | POA: Diagnosis not present

## 2020-04-16 DIAGNOSIS — D225 Melanocytic nevi of trunk: Secondary | ICD-10-CM | POA: Diagnosis not present

## 2020-04-16 DIAGNOSIS — L400 Psoriasis vulgaris: Secondary | ICD-10-CM | POA: Diagnosis not present

## 2020-05-02 DIAGNOSIS — F4310 Post-traumatic stress disorder, unspecified: Secondary | ICD-10-CM | POA: Diagnosis not present

## 2020-05-07 DIAGNOSIS — F4310 Post-traumatic stress disorder, unspecified: Secondary | ICD-10-CM | POA: Diagnosis not present

## 2020-05-16 DIAGNOSIS — M5137 Other intervertebral disc degeneration, lumbosacral region: Secondary | ICD-10-CM | POA: Diagnosis not present

## 2020-05-16 DIAGNOSIS — M5136 Other intervertebral disc degeneration, lumbar region: Secondary | ICD-10-CM | POA: Diagnosis not present

## 2020-05-21 DIAGNOSIS — M5136 Other intervertebral disc degeneration, lumbar region: Secondary | ICD-10-CM | POA: Diagnosis not present

## 2020-05-23 DIAGNOSIS — F4310 Post-traumatic stress disorder, unspecified: Secondary | ICD-10-CM | POA: Diagnosis not present

## 2020-05-28 DIAGNOSIS — F4310 Post-traumatic stress disorder, unspecified: Secondary | ICD-10-CM | POA: Diagnosis not present

## 2020-05-29 DIAGNOSIS — F4310 Post-traumatic stress disorder, unspecified: Secondary | ICD-10-CM | POA: Diagnosis not present

## 2020-06-04 DIAGNOSIS — F4310 Post-traumatic stress disorder, unspecified: Secondary | ICD-10-CM | POA: Diagnosis not present

## 2020-06-06 DIAGNOSIS — F4310 Post-traumatic stress disorder, unspecified: Secondary | ICD-10-CM | POA: Diagnosis not present

## 2020-06-11 DIAGNOSIS — F4310 Post-traumatic stress disorder, unspecified: Secondary | ICD-10-CM | POA: Diagnosis not present

## 2020-06-18 DIAGNOSIS — F4310 Post-traumatic stress disorder, unspecified: Secondary | ICD-10-CM | POA: Diagnosis not present

## 2020-06-20 DIAGNOSIS — G8929 Other chronic pain: Secondary | ICD-10-CM | POA: Diagnosis not present

## 2020-06-20 DIAGNOSIS — M545 Low back pain, unspecified: Secondary | ICD-10-CM | POA: Diagnosis not present

## 2020-06-21 DIAGNOSIS — F4310 Post-traumatic stress disorder, unspecified: Secondary | ICD-10-CM | POA: Diagnosis not present

## 2020-06-25 DIAGNOSIS — F4310 Post-traumatic stress disorder, unspecified: Secondary | ICD-10-CM | POA: Diagnosis not present

## 2020-07-02 DIAGNOSIS — F4310 Post-traumatic stress disorder, unspecified: Secondary | ICD-10-CM | POA: Diagnosis not present

## 2020-07-04 DIAGNOSIS — F4310 Post-traumatic stress disorder, unspecified: Secondary | ICD-10-CM | POA: Diagnosis not present

## 2020-07-08 DIAGNOSIS — M5441 Lumbago with sciatica, right side: Secondary | ICD-10-CM | POA: Diagnosis not present

## 2020-07-08 DIAGNOSIS — G8929 Other chronic pain: Secondary | ICD-10-CM | POA: Diagnosis not present

## 2020-07-08 DIAGNOSIS — M48061 Spinal stenosis, lumbar region without neurogenic claudication: Secondary | ICD-10-CM | POA: Diagnosis not present

## 2020-07-09 DIAGNOSIS — F4310 Post-traumatic stress disorder, unspecified: Secondary | ICD-10-CM | POA: Diagnosis not present

## 2020-07-10 DIAGNOSIS — F4310 Post-traumatic stress disorder, unspecified: Secondary | ICD-10-CM | POA: Diagnosis not present

## 2020-07-24 DIAGNOSIS — F331 Major depressive disorder, recurrent, moderate: Secondary | ICD-10-CM | POA: Diagnosis not present

## 2020-07-24 DIAGNOSIS — F633 Trichotillomania: Secondary | ICD-10-CM | POA: Diagnosis not present

## 2020-07-24 DIAGNOSIS — F419 Anxiety disorder, unspecified: Secondary | ICD-10-CM | POA: Diagnosis not present

## 2020-07-25 DIAGNOSIS — M5441 Lumbago with sciatica, right side: Secondary | ICD-10-CM | POA: Diagnosis not present

## 2020-07-25 DIAGNOSIS — M48061 Spinal stenosis, lumbar region without neurogenic claudication: Secondary | ICD-10-CM | POA: Diagnosis not present

## 2020-07-30 DIAGNOSIS — F4310 Post-traumatic stress disorder, unspecified: Secondary | ICD-10-CM | POA: Diagnosis not present

## 2020-08-01 DIAGNOSIS — F4310 Post-traumatic stress disorder, unspecified: Secondary | ICD-10-CM | POA: Diagnosis not present

## 2020-08-06 DIAGNOSIS — F4310 Post-traumatic stress disorder, unspecified: Secondary | ICD-10-CM | POA: Diagnosis not present

## 2020-08-13 ENCOUNTER — Other Ambulatory Visit: Payer: Self-pay | Admitting: Family Medicine

## 2020-08-13 ENCOUNTER — Ambulatory Visit (INDEPENDENT_AMBULATORY_CARE_PROVIDER_SITE_OTHER): Payer: BC Managed Care – PPO | Admitting: Family Medicine

## 2020-08-13 ENCOUNTER — Encounter: Payer: Self-pay | Admitting: Family Medicine

## 2020-08-13 ENCOUNTER — Other Ambulatory Visit: Payer: Self-pay

## 2020-08-13 VITALS — BP 126/79 | HR 70 | Ht 70.0 in | Wt 167.8 lb

## 2020-08-13 DIAGNOSIS — M5441 Lumbago with sciatica, right side: Secondary | ICD-10-CM

## 2020-08-13 DIAGNOSIS — M5136 Other intervertebral disc degeneration, lumbar region: Secondary | ICD-10-CM | POA: Diagnosis not present

## 2020-08-13 DIAGNOSIS — F331 Major depressive disorder, recurrent, moderate: Secondary | ICD-10-CM

## 2020-08-13 DIAGNOSIS — Z13220 Encounter for screening for lipoid disorders: Secondary | ICD-10-CM

## 2020-08-13 DIAGNOSIS — Z1159 Encounter for screening for other viral diseases: Secondary | ICD-10-CM

## 2020-08-13 DIAGNOSIS — M51369 Other intervertebral disc degeneration, lumbar region without mention of lumbar back pain or lower extremity pain: Secondary | ICD-10-CM

## 2020-08-13 DIAGNOSIS — K5010 Crohn's disease of large intestine without complications: Secondary | ICD-10-CM

## 2020-08-13 DIAGNOSIS — F410 Panic disorder [episodic paroxysmal anxiety] without agoraphobia: Secondary | ICD-10-CM

## 2020-08-13 DIAGNOSIS — F411 Generalized anxiety disorder: Secondary | ICD-10-CM | POA: Diagnosis not present

## 2020-08-13 DIAGNOSIS — Z0000 Encounter for general adult medical examination without abnormal findings: Secondary | ICD-10-CM

## 2020-08-13 DIAGNOSIS — G8929 Other chronic pain: Secondary | ICD-10-CM

## 2020-08-13 DIAGNOSIS — R7309 Other abnormal glucose: Secondary | ICD-10-CM

## 2020-08-13 DIAGNOSIS — Z114 Encounter for screening for human immunodeficiency virus [HIV]: Secondary | ICD-10-CM
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
## 2020-08-13 NOTE — Assessment & Plan Note (Signed)
Chronic low back pain, R>L R sided sciatica history Followed by [facility] Neurosurgery specialist Past ESI Lumbar therapy, has DDD Lumbar L5 region Controlled currently - with multiple therapy options in past PT, Chiropractor

## 2020-08-13 NOTE — Assessment & Plan Note (Addendum)
Currently controlled Managed by Psychiatry Carleene Mains NP, [facility]) On med management with Wellbutrin, Clonidine, Prozac, therapy

## 2020-08-13 NOTE — Patient Instructions (Addendum)
Thank you for coming to the office today.   DUE for FASTING BLOOD WORK (no food or drink after midnight before the lab appointment, only water or coffee without cream/sugar on the morning of)  SCHEDULE "Lab Only" visit in the morning at the clinic for lab draw in 1-3 MONTHS   - Make sure Lab Only appointment is at about 1 week before your next appointment, so that results will be available  For Lab Results, once available within 2-3 days of blood draw, you can can log in to MyChart online to view your results and a brief explanation. Also, we can discuss results at next follow-up visit.    Please schedule a Follow-up Appointment to: Return in about 6 weeks (around 09/24/2020) for 6 week fasting lab only then 1 week later Annual Physical.  If you have any other questions or concerns, please feel free to call the office or send a message through MyChart. You may also schedule an earlier appointment if necessary.  Additionally, you may be receiving a survey about your experience at our office within a few days to 1 week by e-mail or mail. We value your feedback.  Saralyn Pilar, DO [facility], [place]

## 2020-08-13 NOTE — Assessment & Plan Note (Signed)
Chronic Crohn's disease managed by GI specialist (Dr Barbie Banner, [facility]) Prior Colonoscopy 2009 initial dx, and 07/2019 On Cimzea therapy

## 2020-08-13 NOTE — Assessment & Plan Note (Signed)
Chronic low back pain, R>L R sided sciatica history Followed by [facility] specialist Past ESI Lumbar therapy, has DDD Lumbar L5 region Controlled currently - with multiple therapy options in past PT, Chiropractor 

## 2020-08-15 DIAGNOSIS — F4310 Post-traumatic stress disorder, unspecified: Secondary | ICD-10-CM | POA: Diagnosis not present

## 2020-08-20 DIAGNOSIS — F4310 Post-traumatic stress disorder, unspecified: Secondary | ICD-10-CM | POA: Diagnosis not present

## 2020-08-26 DIAGNOSIS — G8929 Other chronic pain: Secondary | ICD-10-CM | POA: Diagnosis not present

## 2020-08-26 DIAGNOSIS — M5441 Lumbago with sciatica, right side: Secondary | ICD-10-CM | POA: Diagnosis not present

## 2020-08-26 DIAGNOSIS — M48061 Spinal stenosis, lumbar region without neurogenic claudication: Secondary | ICD-10-CM | POA: Diagnosis not present

## 2020-08-27 DIAGNOSIS — F4310 Post-traumatic stress disorder, unspecified: Secondary | ICD-10-CM | POA: Diagnosis not present

## 2020-09-03 DIAGNOSIS — F4310 Post-traumatic stress disorder, unspecified: Secondary | ICD-10-CM | POA: Diagnosis not present

## 2020-09-04 DIAGNOSIS — F331 Major depressive disorder, recurrent, moderate: Secondary | ICD-10-CM | POA: Diagnosis not present

## 2020-09-04 DIAGNOSIS — F633 Trichotillomania: Secondary | ICD-10-CM | POA: Diagnosis not present

## 2020-09-04 DIAGNOSIS — F419 Anxiety disorder, unspecified: Secondary | ICD-10-CM | POA: Diagnosis not present

## 2020-09-04 IMAGING — CR DG THORACIC SPINE 2V
1 series · 3 of 3 positions shown · non-contrast
Comparison: None.

CLINICAL DATA: Severe right thoracic back pain.  No known injury.

EXAM:
THORACIC SPINE 2 VIEWS

[Series 1: dg thoracic spine 2 view · 0.14mm/px · 3 of 3 slices shown]
[im 1/3]
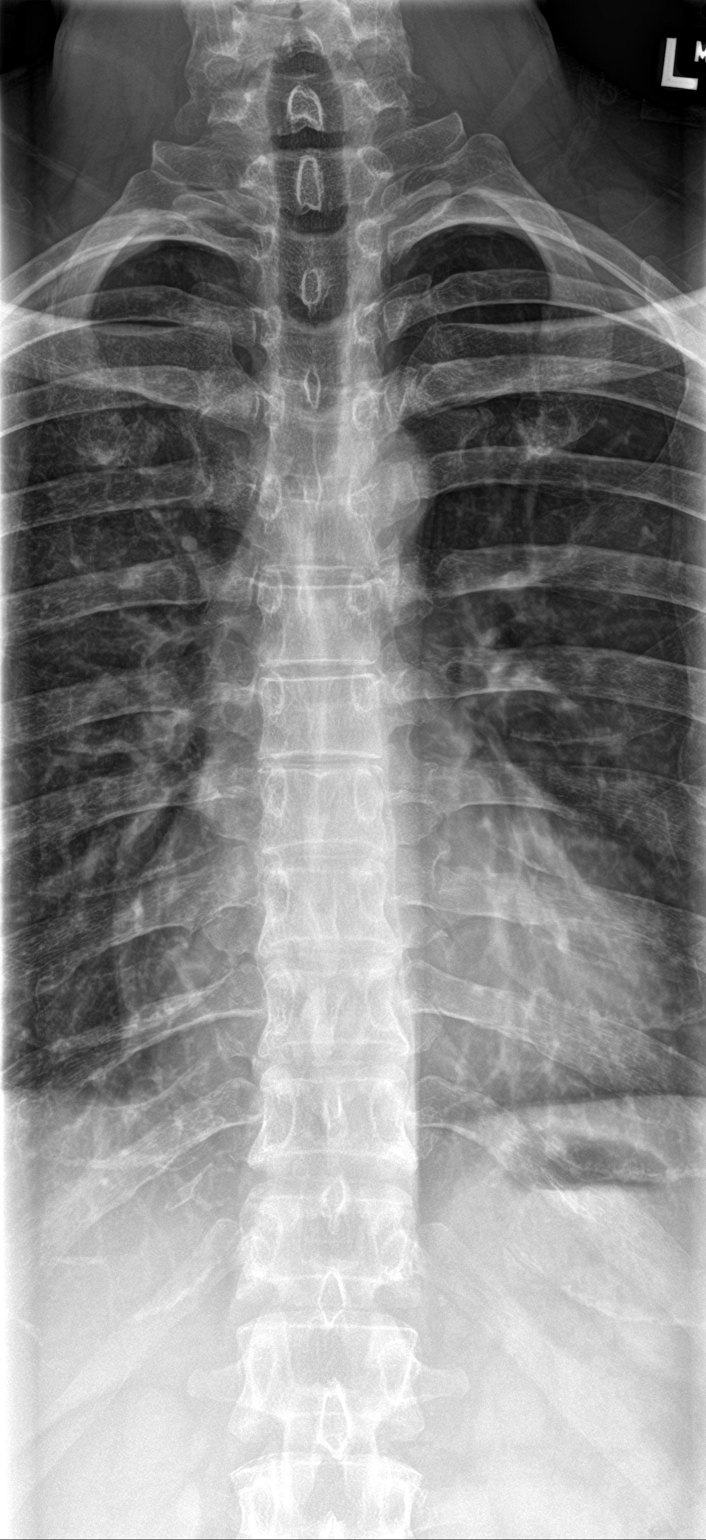
[im 2/3]
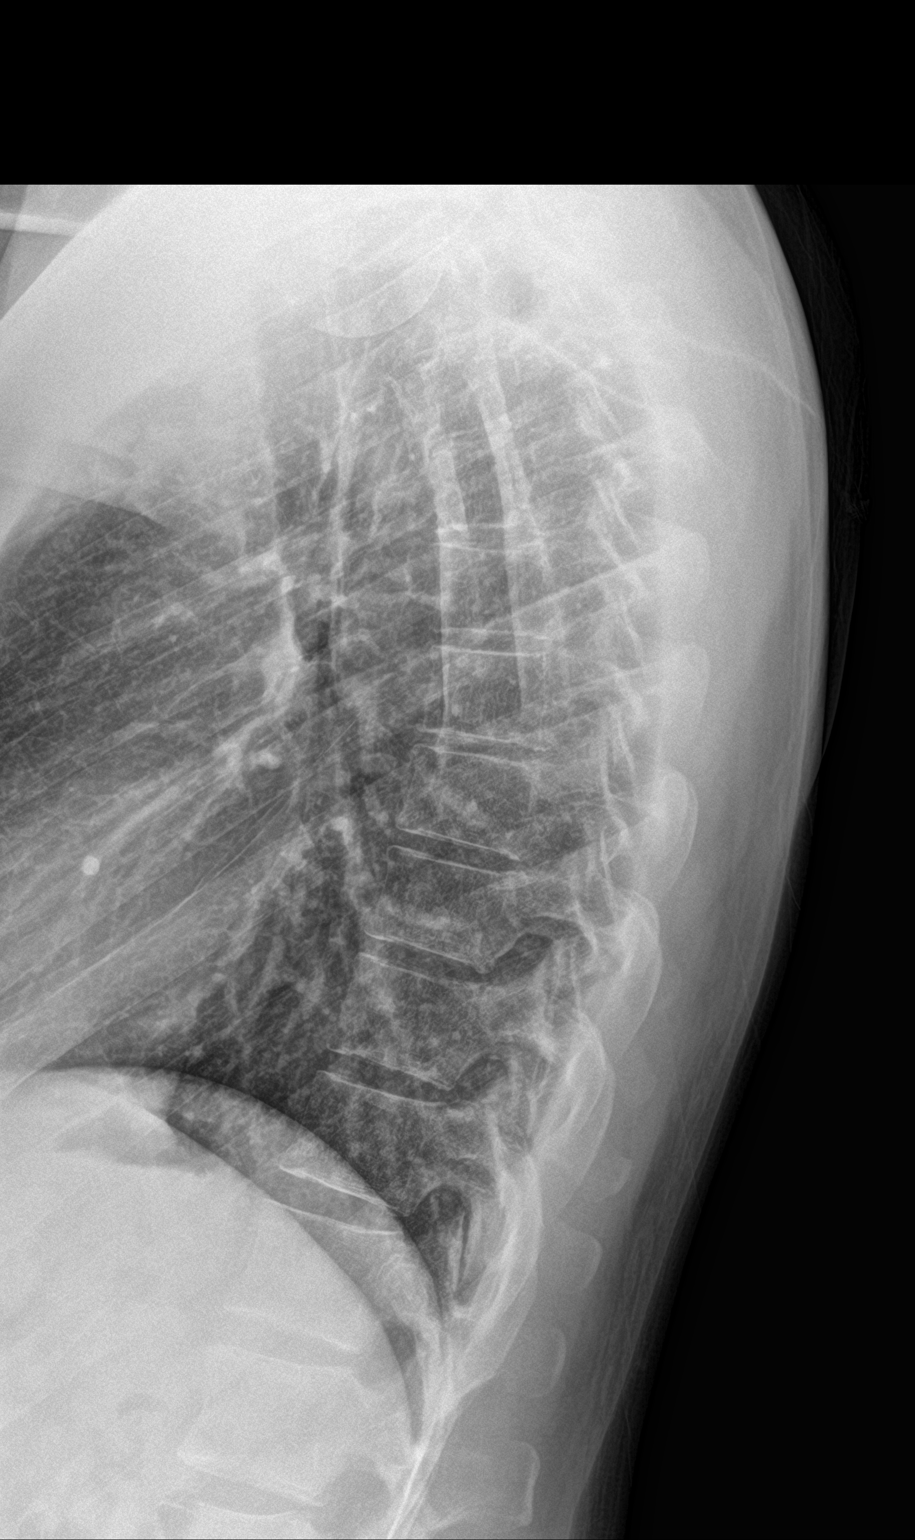
[im 3/3]
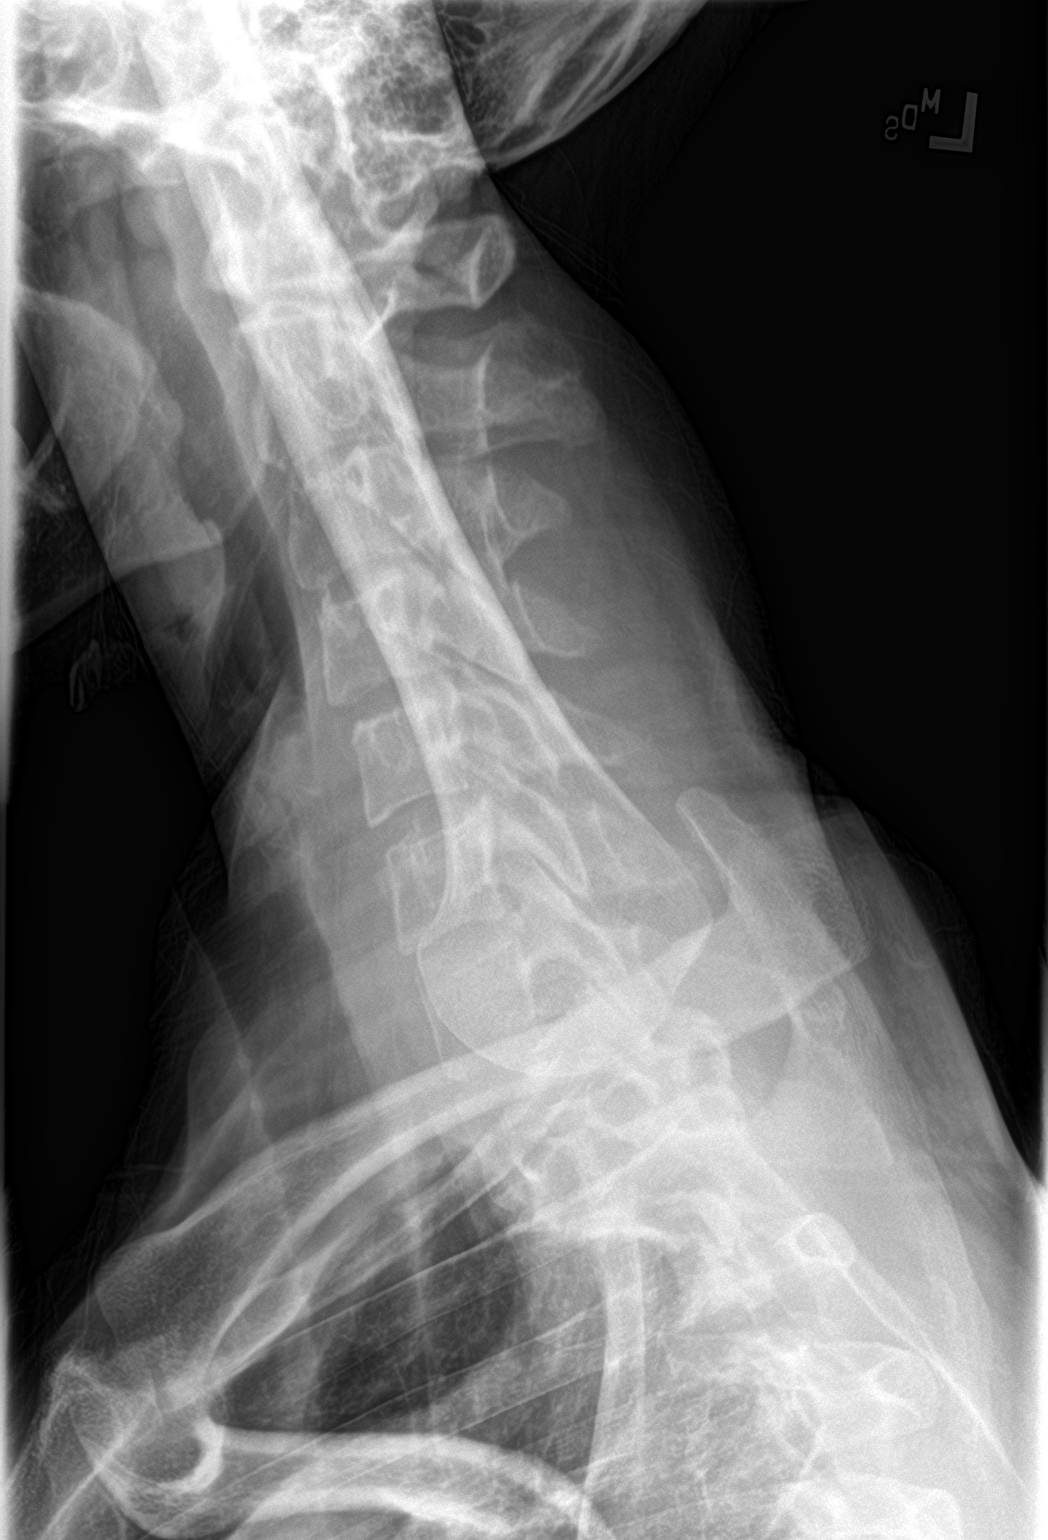

[3 of 3 positions shown; findings below may reference images not displayed]

FINDINGS: There is no evidence of thoracic spine fracture. Alignment is
normal. No other significant bone abnormalities are identified.
IMPRESSION: Negative.

## 2020-09-05 DIAGNOSIS — F4310 Post-traumatic stress disorder, unspecified: Secondary | ICD-10-CM | POA: Diagnosis not present

## 2020-09-10 DIAGNOSIS — F4310 Post-traumatic stress disorder, unspecified: Secondary | ICD-10-CM | POA: Diagnosis not present

## 2020-09-12 DIAGNOSIS — F4310 Post-traumatic stress disorder, unspecified: Secondary | ICD-10-CM | POA: Diagnosis not present

## 2020-09-16 ENCOUNTER — Other Ambulatory Visit: Payer: Self-pay | Admitting: *Deleted

## 2020-09-16 DIAGNOSIS — Z1159 Encounter for screening for other viral diseases: Secondary | ICD-10-CM

## 2020-09-16 DIAGNOSIS — Z0000 Encounter for general adult medical examination without abnormal findings: Secondary | ICD-10-CM

## 2020-09-16 DIAGNOSIS — F331 Major depressive disorder, recurrent, moderate: Secondary | ICD-10-CM

## 2020-09-16 DIAGNOSIS — F410 Panic disorder [episodic paroxysmal anxiety] without agoraphobia: Secondary | ICD-10-CM

## 2020-09-16 DIAGNOSIS — K5010 Crohn's disease of large intestine without complications: Secondary | ICD-10-CM

## 2020-09-16 DIAGNOSIS — Z13220 Encounter for screening for lipoid disorders: Secondary | ICD-10-CM

## 2020-09-16 DIAGNOSIS — R7309 Other abnormal glucose: Secondary | ICD-10-CM

## 2020-09-16 DIAGNOSIS — Z114 Encounter for screening for human immunodeficiency virus [HIV]: Secondary | ICD-10-CM

## 2020-09-17 ENCOUNTER — Other Ambulatory Visit: Payer: BC Managed Care – PPO

## 2020-09-17 DIAGNOSIS — K5010 Crohn's disease of large intestine without complications: Secondary | ICD-10-CM | POA: Diagnosis not present

## 2020-09-17 DIAGNOSIS — F331 Major depressive disorder, recurrent, moderate: Secondary | ICD-10-CM | POA: Diagnosis not present

## 2020-09-17 DIAGNOSIS — Z1159 Encounter for screening for other viral diseases: Secondary | ICD-10-CM | POA: Diagnosis not present

## 2020-09-17 DIAGNOSIS — R7309 Other abnormal glucose: Secondary | ICD-10-CM | POA: Diagnosis not present

## 2020-09-17 DIAGNOSIS — Z13220 Encounter for screening for lipoid disorders: Secondary | ICD-10-CM | POA: Diagnosis not present

## 2020-09-18 DIAGNOSIS — F419 Anxiety disorder, unspecified: Secondary | ICD-10-CM | POA: Diagnosis not present

## 2020-09-18 DIAGNOSIS — F633 Trichotillomania: Secondary | ICD-10-CM | POA: Diagnosis not present

## 2020-09-18 DIAGNOSIS — F331 Major depressive disorder, recurrent, moderate: Secondary | ICD-10-CM | POA: Diagnosis not present

## 2020-09-18 LAB — COMPLETE METABOLIC PANEL WITH GFR
AG Ratio: 1.4 (calc) (ref 1.0–2.5)
ALT: 43 U/L (ref 9–46)
AST: 30 U/L (ref 10–40)
Albumin: 4.3 g/dL (ref 3.6–5.1)
Alkaline phosphatase (APISO): 54 U/L (ref 36–130)
BUN: 9 mg/dL (ref 7–25)
CO2: 30 mmol/L (ref 20–32)
Calcium: 9.6 mg/dL (ref 8.6–10.3)
Chloride: 103 mmol/L (ref 98–110)
Creat: 0.82 mg/dL (ref 0.60–1.35)
GFR, Est African American: 136 mL/min/{1.73_m2} (ref >=60)
GFR, Est Non African American: 117 mL/min/{1.73_m2} (ref >=60)
Globulin: 3.1 g/dL (calc) (ref 1.9–3.7)
Glucose, Bld: 95 mg/dL (ref 65–99)
Potassium: 4.2 mmol/L (ref 3.5–5.3)
Sodium: 138 mmol/L (ref 135–146)
Total Bilirubin: 0.6 mg/dL (ref 0.2–1.2)
Total Protein: 7.4 g/dL (ref 6.1–8.1)

## 2020-09-18 LAB — CBC WITH DIFFERENTIAL/PLATELET
Absolute Monocytes: 337 cells/uL (ref 200–950)
Basophils Absolute: 41 cells/uL (ref 0–200)
Basophils Relative: 1.1 %
Eosinophils Absolute: 30 cells/uL (ref 15–500)
Eosinophils Relative: 0.8 %
HCT: 38.6 % (ref 38.5–50.0)
Hemoglobin: 13.2 g/dL (ref 13.2–17.1)
Lymphs Abs: 1643 cells/uL (ref 850–3900)
MCH: 32.3 pg (ref 27.0–33.0)
MCHC: 34.2 g/dL (ref 32.0–36.0)
MCV: 94.4 fL (ref 80.0–100.0)
MPV: 10 fL (ref 7.5–12.5)
Monocytes Relative: 9.1 %
Neutro Abs: 1650 cells/uL (ref 1500–7800)
Neutrophils Relative %: 44.6 %
Platelets: 245 10*3/uL (ref 140–400)
RBC: 4.09 10*6/uL — ABNORMAL LOW (ref 4.20–5.80)
RDW: 11.9 % (ref 11.0–15.0)
Total Lymphocyte: 44.4 %
WBC: 3.7 10*3/uL — ABNORMAL LOW (ref 3.8–10.8)

## 2020-09-18 LAB — LIPID PANEL
Cholesterol: 150 mg/dL (ref <200)
HDL: 53 mg/dL (ref >=40)
LDL Cholesterol (Calc): 84 mg/dL (calc)
Non-HDL Cholesterol (Calc): 97 mg/dL (calc) (ref <130)
Total CHOL/HDL Ratio: 2.8 (calc) (ref <5.0)
Triglycerides: 44 mg/dL (ref <150)

## 2020-09-18 LAB — HEMOGLOBIN A1C
Hgb A1c MFr Bld: 5.5 % of total Hgb (ref <5.7)
Mean Plasma Glucose: 111 mg/dL
eAG (mmol/L): 6.2 mmol/L

## 2020-09-18 LAB — HEPATITIS C ANTIBODY
Hepatitis C Ab: NONREACTIVE
SIGNAL TO CUT-OFF: 0.01 (ref <1.00)

## 2020-09-18 LAB — HIV ANTIBODY (ROUTINE TESTING W REFLEX): HIV 1&2 Ab, 4th Generation: NONREACTIVE

## 2020-09-19 DIAGNOSIS — F4310 Post-traumatic stress disorder, unspecified: Secondary | ICD-10-CM | POA: Diagnosis not present

## 2020-09-24 ENCOUNTER — Ambulatory Visit (INDEPENDENT_AMBULATORY_CARE_PROVIDER_SITE_OTHER): Payer: BC Managed Care – PPO | Admitting: Family Medicine

## 2020-09-24 ENCOUNTER — Other Ambulatory Visit: Payer: Self-pay | Admitting: Family Medicine

## 2020-09-24 ENCOUNTER — Other Ambulatory Visit: Payer: Self-pay

## 2020-09-24 ENCOUNTER — Encounter: Payer: Self-pay | Admitting: Family Medicine

## 2020-09-24 VITALS — BP 127/78 | HR 78 | Temp 98.6°F | Ht 70.0 in | Wt 176.4 lb

## 2020-09-24 DIAGNOSIS — E559 Vitamin D deficiency, unspecified: Secondary | ICD-10-CM

## 2020-09-24 DIAGNOSIS — K5010 Crohn's disease of large intestine without complications: Secondary | ICD-10-CM

## 2020-09-24 DIAGNOSIS — Z79899 Other long term (current) drug therapy: Secondary | ICD-10-CM

## 2020-09-24 DIAGNOSIS — F410 Panic disorder [episodic paroxysmal anxiety] without agoraphobia: Secondary | ICD-10-CM

## 2020-09-24 DIAGNOSIS — Z0000 Encounter for general adult medical examination without abnormal findings: Secondary | ICD-10-CM | POA: Diagnosis not present

## 2020-09-24 DIAGNOSIS — F331 Major depressive disorder, recurrent, moderate: Secondary | ICD-10-CM

## 2020-09-24 DIAGNOSIS — F411 Generalized anxiety disorder: Secondary | ICD-10-CM

## 2020-09-24 DIAGNOSIS — R5383 Other fatigue: Secondary | ICD-10-CM

## 2020-09-24 DIAGNOSIS — L409 Psoriasis, unspecified: Secondary | ICD-10-CM | POA: Insufficient documentation

## 2020-09-24 DIAGNOSIS — E538 Deficiency of other specified B group vitamins: Secondary | ICD-10-CM

## 2020-09-24 NOTE — Patient Instructions (Addendum)
Thank you for coming to the office today.  Your blood work was excellent. Normal cholesterol, normal kidney, sugar, and other blood results.  Only abnormal was mild low WBC 3.7, similar to past results  I do not think your chest tightness is related to heart at this time.  We will check Vitamin results - B12, D, and Thyroid panel.  Sleep Hygiene Recommendations to promote healthy sleep in all patients, especially if symptoms of insomnia are worsening. Due to the nature of sleep rhythms, if your body gets "out of rhythm", it may take some time before your sleep cycle can be "reset".  Please try to follow as many of the following tips as you can, usually there are only a few of these are the primary cause of the problem.  ?To reset your sleep rhythm, go to bed and get up at the same time every day ?Sleep only long enough to feel rested and then get out of bed ?Do not try to force yourself to sleep. If you can't sleep, get out of bed and try again later. ?Avoid naps during the day, unless excessively tired. The more sleeping during the day, then the less sleep your body needs at night.  ?Have coffee, tea, and other foods that have caffeine only in the morning ?Exercise several days a week, but not right before bed ?If you drink alcohol, prefer to have appropriate drink with one meal, but prefer to avoid alcohol in the evening, and bedtime ?If you smoke, avoid smoking, especially in the evening  ?Avoid watching TV or looking at phones, computers, or reading devices ("e-books") that give off light at least 30 minutes before bed. This artificial light sends "awake signals" to your brain and can make it harder to fall asleep. ?Make your bedroom a comfortable place where it is easy to fall asleep: ? Put up shades or special blackout curtains to block light from outside. ? Use a white noise machine to block noise. ? Keep the temperature cool. ?Try your best to solve or at least address your  problems before you go to bed ?Use relaxation techniques to manage stress. Ask your health care provider to suggest some techniques that may work well for you. These may include: ? Breathing exercises. ? Routines to release muscle tension. ? Visualizing peaceful scenes.   DUE for FASTING BLOOD WORK (no food or drink after midnight before the lab appointment, only water or coffee without cream/sugar on the morning of)  SCHEDULE "Lab Only" visit in the morning at the clinic for lab draw in 3 MONTHS   - Make sure Lab Only appointment is at about 1 week before your next appointment, so that results will be available  For Lab Results, once available within 2-3 days of blood draw, you can can log in to MyChart online to view your results and a brief explanation. Also, we can discuss results at next follow-up visit.   Please schedule a Follow-up Appointment to: Return in about 3 months (around 12/25/2020) for 3 month fasting lab only then 1 week later Follow-up Lab results Fatigue/Tired, Vitamin Testing.  If you have any other questions or concerns, please feel free to call the office or send a message through MyChart. You may also schedule an earlier appointment if necessary.  Additionally, you may be receiving a survey about your experience at our office within a few days to 1 week by e-mail or mail. We value your feedback.  Saralyn Pilar, DO [facility], [facility]

## 2020-09-24 NOTE — Progress Notes (Signed)
Subjective:    Patient ID: Cole Everett, male    DOB: 29-Jul-1985, 35 y.o.   MRN: [id number]  Cole Everett is a 35 y.o. male presenting on 09/24/2020 for Annual Exam and Chest Pain (Pt feels some tightness feeling but he believes it relates to his anxiety, It a everyday feeling.)   HPI   Here for Annual Physical  Overall doing well, except concerns of chest tightness see below Lab review.  Chronic Back Pain Bulging / Herniated Disc L5 Lumbar DDD Reports onset 0/9407, uncertain etiology, he had sciatica in low back and R sided with sciatica symptoms, radiating into hip. He went to [facility] and did PT treatment. Also has done Chiropractor [facility] - Dr Noel Gerold), and dry needling and TENS machine. - Prior MRI showed slight tear / DDD Lumbar [facility] spine - S/p L5 ESI epidural 07/25/20, with some delayed improvement now with reduced nerve pain. Still has some associated symptoms slightly higher but overall improved. Next follow-up 08/26/20 - Previously on Flexeril in past.  Crohn's Disease Followed by [facility] - Dr Ottis Stain Initial diagnosis 2008-2009. He said had sudden flare and diagnosed since, with chronic problems. No known fam history He is on Cimzia monthly injection He has done some Intermittent fasting to help diet.  _______________________________   Generalized Anxiety Disorder with Panic Attacks History of Alcohol Abuse  Followed by Psychiatry Psych encouraged him to be evaluated at physical for evaluating medical causes of his chest tightness symptoms. He says improved with Prozac. Symptoms can be mild at times 4 out of 10, and if severe 8 out of 10, often assoc with shorter shallow breathing, difficulty concentrating, then triggers anxiety which worsens. He does experience some mild symptoms with chest tightness daily. The worsening constellation symptoms are circumstantial with actual stress events. - Not having  significant exertional symptoms. - Seems improved  Chronic history of mixed anxiety and depression for years  Additionally history of PTSD             Previously on Wellbutrin and Clonidine. Recently they have initiated Prozac therapy, to help more of the anxiety. It has greatly improved his symptoms. Reduced chest tightness on prozac  He asks about energy and fatigue as well - Check future energy labs - Vitamin B12, D, Thyroid panel. He thinks Prozac can cause him to be slowed down and tired or fatigued or reduced energy at times. Future discuss sleep - he has tried Melatonin and some CBD Oil PRN for back pain and sleep.  Scalp psoriasis - ketoconazole, clobetasol foam, paradoxical from cimzia  Health Maintenance:  COVID Vaccine Moderna 2nd dose 02/11/20   Depression screen [facility] 2/9 09/24/2020 08/13/2020  Decreased Interest 0 2  Down, Depressed, Hopeless 1 1  PHQ - 2 Score 1 3  Altered sleeping 2 1  Tired, decreased energy 3 3  Change in appetite 2 2  Feeling bad or failure about yourself  2 2  Trouble concentrating 2 0  Moving slowly or fidgety/restless 1 1  Suicidal thoughts 1 0  PHQ-9 Score 14 12  Difficult doing work/chores Not difficult at all Somewhat difficult   GAD 7 : Generalized Anxiety Score 09/24/2020 08/13/2020  Nervous, Anxious, on Edge 3 2  Control/stop worrying 2 2  Worry too much - different things 2 3  Trouble relaxing 2 2  Restless 2 3  Easily annoyed or irritable 2 1  Afraid - awful might happen 1 1  Total GAD 7  Score 14 14  Anxiety Difficulty Somewhat difficult Somewhat difficult     Past Medical History:  Diagnosis Date  . Allergy to alpha-gal   . Anxiety   . Crohn disease ([place])   . GERD (gastroesophageal reflux disease)   . Hypertension   . Lumbar disc disease   . Seborrheic dermatitis    Past Surgical History:  Procedure Laterality Date  . BUNIONECTOMY    . FOOT SURGERY     Social History   Socioeconomic History  . Marital status:  Married    Spouse name: Not on file  . Number of children: Not on file  . Years of education: Not on file  . Highest education level: Not on file  Occupational History  . Not on file  Tobacco Use  . Smoking status: Never Smoker  . Smokeless tobacco: Never Used  Vaping Use  . Vaping Use: Never used  Substance and Sexual Activity  . Alcohol use: Yes    Alcohol/week: 1.0 standard drink    Types: 1 Glasses of wine per week    Comment: occasionally  . Drug use: Yes    Types: Marijuana    Comment: last use last night  . Sexual activity: Not on file  Other Topics Concern  . Not on file  Social History Narrative  . Not on file   Social Determinants of Health   Financial Resource Strain: Not on file  Food Insecurity: Not on file  Transportation Needs: Not on file  Physical Activity: Not on file  Stress: Not on file  Social Connections: Not on file  Intimate Partner Violence: Not on file   Family History  Problem Relation Age of Onset  . Irritable bowel syndrome Mother   . Hypertension Mother   . Heart disease Mother   . Cirrhosis Father   . Liver cancer Father   . Thyroid disease Sister   . Alzheimer's disease Paternal Grandmother    Current Outpatient Medications on File Prior to Visit  Medication Sig  . buPROPion (WELLBUTRIN XL) 300 MG 24 hr tablet Take 300 mg by mouth daily.  Marland Kitchen CIMZIA PREFILLED 2 X 200 MG/ML KIT every 30 (thirty) days. For Crohn's  . cloNIDine (CATAPRES) 0.1 MG tablet Take 2 tablets by mouth at bedtime.  Marland Kitchen FLUoxetine (PROZAC) 20 MG capsule Take 20 mg by mouth daily.   No current facility-administered medications on file prior to visit.    Review of Systems  Constitutional: Negative for activity change, appetite change, chills, diaphoresis, fatigue and fever.  HENT: Negative for congestion and hearing loss.   Eyes: Negative for visual disturbance.  Respiratory: Negative for apnea, cough, choking, chest tightness, shortness of breath and wheezing.    Cardiovascular: Negative for chest pain, palpitations and leg swelling.  Gastrointestinal: Negative for abdominal pain, anal bleeding, blood in stool, constipation, diarrhea, nausea and vomiting.  Endocrine: Negative for cold intolerance.  Genitourinary: Negative for difficulty urinating, dysuria, frequency and hematuria.  Musculoskeletal: Negative for arthralgias, back pain and neck pain.  Skin: Negative for rash.  Allergic/Immunologic: Negative for environmental allergies.  Neurological: Negative for dizziness, weakness, light-headedness, numbness and headaches.  Hematological: Negative for adenopathy.  Psychiatric/Behavioral: Negative for behavioral problems, dysphoric mood and sleep disturbance. The patient is nervous/anxious.    Per HPI unless specifically indicated above      Objective:    BP 127/78 (BP Location: Left Arm, Patient Position: Sitting, Cuff Size: Normal)   Pulse 78   Temp 98.6 F (37 C) (Temporal)  Ht _0  (1.778 m)   Wt 176 lb 6.4 oz (80 kg)   SpO2 100%   BMI 25.31 kg/m   Wt Readings from Last 3 Encounters:  09/24/20 176 lb 6.4 oz (80 kg)  08/13/20 167 lb 12.8 oz (76.1 kg)  06/05/19 195 lb (88.5 kg)    Physical Exam Vitals and nursing note reviewed.  Constitutional:      General: He is not in acute distress.    Appearance: He is well-developed and well-nourished. He is not diaphoretic.     Comments: Well-appearing, comfortable, cooperative  HENT:     Head: Normocephalic and atraumatic.     Mouth/Throat:     Mouth: Oropharynx is clear and moist.  Eyes:     General:        Right eye: No discharge.        Left eye: No discharge.     Extraocular Movements: EOM normal.     Conjunctiva/sclera: Conjunctivae normal.     Pupils: Pupils are equal, round, and reactive to light.  Neck:     Thyroid: No thyromegaly.     Vascular: No carotid bruit.  Cardiovascular:     Rate and Rhythm: Normal rate and regular rhythm.     Pulses: Intact distal pulses.      Heart sounds: Normal heart sounds. No murmur heard.   Pulmonary:     Effort: Pulmonary effort is normal. No respiratory distress.     Breath sounds: Normal breath sounds. No wheezing or rales.  Abdominal:     General: Bowel sounds are normal. There is no distension.     Palpations: Abdomen is soft. There is no mass.     Tenderness: There is no abdominal tenderness.  Musculoskeletal:        General: No tenderness or edema. Normal range of motion.     Cervical back: Normal range of motion and neck supple.     Right lower leg: No edema.     Left lower leg: No edema.     Comments: Upper / Lower Extremities: - Normal muscle tone, strength bilateral upper extremities 5/5, lower extremities 5/5  Lymphadenopathy:     Cervical: No cervical adenopathy.  Skin:    General: Skin is warm and dry.     Findings: No erythema or rash.  Neurological:     Mental Status: He is alert and oriented to person, place, and time.     Comments: Distal sensation intact to light touch all extremities  Psychiatric:        Mood and Affect: Mood and affect normal.        Behavior: Behavior normal.     Comments: Well groomed, good eye contact, normal speech and thoughts       Results for orders placed or performed in visit on 09/16/20  HIV Antibody (routine testing w rflx)  Result Value Ref Range   HIV 1&2 Ab, 4th Generation NON-REACTIVE NON-REACTI  Hepatitis C antibody  Result Value Ref Range   Hepatitis C Ab NON-REACTIVE NON-REACTI   SIGNAL TO CUT-OFF 0.01 <1.00  Lipid panel  Result Value Ref Range   Cholesterol 150 <200 mg/dL   HDL 53 > OR = 40 mg/dL   Triglycerides 44 <150 mg/dL   LDL Cholesterol (Calc) 84 mg/dL (calc)   Total CHOL/HDL Ratio 2.8 <5.0 (calc)   Non-HDL Cholesterol (Calc) 97 <130 mg/dL (calc)  COMPLETE METABOLIC PANEL WITH GFR  Result Value Ref Range   Glucose, Bld 95 65 -  99 mg/dL   BUN 9 7 - 25 mg/dL   Creat 0.82 0.60 - 1.35 mg/dL   GFR, Est Non African American 117 > OR = 60  mL/min/1.27m   GFR, Est African American 136 > OR = 60 mL/min/1.761m  BUN/Creatinine Ratio NOT APPLICABLE 6 - 22 (calc)   Sodium 138 135 - 146 mmol/L   Potassium 4.2 3.5 - 5.3 mmol/L   Chloride 103 98 - 110 mmol/L   CO2 30 20 - 32 mmol/L   Calcium 9.6 8.6 - 10.3 mg/dL   Total Protein 7.4 6.1 - 8.1 g/dL   Albumin 4.3 3.6 - 5.1 g/dL   Globulin 3.1 1.9 - 3.7 g/dL (calc)   AG Ratio 1.4 1.0 - 2.5 (calc)   Total Bilirubin 0.6 0.2 - 1.2 mg/dL   Alkaline phosphatase (APISO) 54 36 - 130 U/L   AST 30 10 - 40 U/L   ALT 43 9 - 46 U/L  CBC with Differential/Platelet  Result Value Ref Range   WBC 3.7 (L) 3.8 - 10.8 Thousand/uL   RBC 4.09 (L) 4.20 - 5.80 Million/uL   Hemoglobin 13.2 13.2 - 17.1 g/dL   HCT 38.6 38.5 - 50.0 %   MCV 94.4 80.0 - 100.0 fL   MCH 32.3 27.0 - 33.0 pg   MCHC 34.2 32.0 - 36.0 g/dL   RDW 11.9 11.0 - 15.0 %   Platelets 245 140 - 400 Thousand/uL   MPV 10.0 7.5 - 12.5 fL   Neutro Abs 1,650 1,500 - 7,800 cells/uL   Lymphs Abs 1,643 850 - 3,900 cells/uL   Absolute Monocytes 337 200 - 950 cells/uL   Eosinophils Absolute 30 15 - 500 cells/uL   Basophils Absolute 41 0 - 200 cells/uL   Neutrophils Relative % 44.6 %   Total Lymphocyte 44.4 %   Monocytes Relative 9.1 %   Eosinophils Relative 0.8 %   Basophils Relative 1.1 %  Hemoglobin A1c  Result Value Ref Range   Hgb A1c MFr Bld 5.5 <5.7 % of total Hgb   Mean Plasma Glucose 111 mg/dL   eAG (mmol/L) 6.2 mmol/L      Assessment & Plan:   Problem List Items Addressed This Visit    Scalp psoriasis   Moderate recurrent major depression (HCC)   Generalized anxiety disorder with panic attacks   Crohn's disease of large intestine without complication ([place]   Other Visit Diagnoses    Annual physical exam    -  Primary      Updated Health Maintenance information Reviewed recent lab results with patient Encouraged improvement to lifestyle with diet and exercise Maintain healthy weight  Reviewed chronic health  conditions  Specific updates today regarding anxiety and panic attacks Clinically most consistent with this anxiety etiology No abnormal cardiovascular risk factors or predictors on lab work, history, fam history or evaluation today No further cardiac testing indicated, he has had prior EKG and stress testing done by prior providers years back for similar. Now improved on Prozac, atypical symptoms not consistent with cardiovascular cause. Reassurance.  No orders of the defined types were placed in this encounter.     Follow up plan: Return in about 3 months (around 12/25/2020) for 3 month fasting lab only then 1 week later Follow-up Lab results Fatigue/Tired, Vitamin Testing.  Future labs ordered for 12/24/20 - B12, D, and Thyroid panel  AlNobie PutnamDO [facility] [place] 09/24/2020, 9:59 AM

## 2020-09-26 DIAGNOSIS — F4310 Post-traumatic stress disorder, unspecified: Secondary | ICD-10-CM | POA: Diagnosis not present

## 2020-10-08 DIAGNOSIS — F4310 Post-traumatic stress disorder, unspecified: Secondary | ICD-10-CM | POA: Diagnosis not present

## 2020-10-10 DIAGNOSIS — F4310 Post-traumatic stress disorder, unspecified: Secondary | ICD-10-CM | POA: Diagnosis not present

## 2020-10-22 DIAGNOSIS — F4310 Post-traumatic stress disorder, unspecified: Secondary | ICD-10-CM | POA: Diagnosis not present

## 2020-10-30 DIAGNOSIS — F4310 Post-traumatic stress disorder, unspecified: Secondary | ICD-10-CM | POA: Diagnosis not present

## 2020-11-01 DIAGNOSIS — F4310 Post-traumatic stress disorder, unspecified: Secondary | ICD-10-CM | POA: Diagnosis not present

## 2020-11-05 DIAGNOSIS — F4312 Post-traumatic stress disorder, chronic: Secondary | ICD-10-CM | POA: Diagnosis not present

## 2020-11-07 DIAGNOSIS — F4312 Post-traumatic stress disorder, chronic: Secondary | ICD-10-CM | POA: Diagnosis not present

## 2020-11-12 DIAGNOSIS — F411 Generalized anxiety disorder: Secondary | ICD-10-CM | POA: Diagnosis not present

## 2020-11-18 DIAGNOSIS — F4310 Post-traumatic stress disorder, unspecified: Secondary | ICD-10-CM | POA: Diagnosis not present

## 2020-11-21 DIAGNOSIS — F4310 Post-traumatic stress disorder, unspecified: Secondary | ICD-10-CM | POA: Diagnosis not present

## 2020-11-25 DIAGNOSIS — F4310 Post-traumatic stress disorder, unspecified: Secondary | ICD-10-CM | POA: Diagnosis not present

## 2020-12-09 DIAGNOSIS — F4310 Post-traumatic stress disorder, unspecified: Secondary | ICD-10-CM | POA: Diagnosis not present

## 2020-12-23 DIAGNOSIS — F4310 Post-traumatic stress disorder, unspecified: Secondary | ICD-10-CM | POA: Diagnosis not present

## 2020-12-24 ENCOUNTER — Other Ambulatory Visit: Payer: Self-pay

## 2020-12-24 ENCOUNTER — Other Ambulatory Visit: Payer: BC Managed Care – PPO

## 2020-12-24 DIAGNOSIS — E538 Deficiency of other specified B group vitamins: Secondary | ICD-10-CM

## 2020-12-24 DIAGNOSIS — Z79899 Other long term (current) drug therapy: Secondary | ICD-10-CM | POA: Diagnosis not present

## 2020-12-24 DIAGNOSIS — F410 Panic disorder [episodic paroxysmal anxiety] without agoraphobia: Secondary | ICD-10-CM

## 2020-12-24 DIAGNOSIS — R5383 Other fatigue: Secondary | ICD-10-CM | POA: Diagnosis not present

## 2020-12-24 DIAGNOSIS — E559 Vitamin D deficiency, unspecified: Secondary | ICD-10-CM

## 2020-12-24 DIAGNOSIS — F331 Major depressive disorder, recurrent, moderate: Secondary | ICD-10-CM | POA: Diagnosis not present

## 2020-12-24 DIAGNOSIS — F411 Generalized anxiety disorder: Secondary | ICD-10-CM | POA: Diagnosis not present

## 2020-12-24 DIAGNOSIS — K5010 Crohn's disease of large intestine without complications: Secondary | ICD-10-CM

## 2020-12-25 LAB — VITAMIN D 25 HYDROXY (VIT D DEFICIENCY, FRACTURES): Vit D, 25-Hydroxy: 40 ng/mL (ref 30–100)

## 2020-12-25 LAB — THYROID PANEL WITH TSH
Free Thyroxine Index: 2.1 (ref 1.4–3.8)
T3 Uptake: 35 % (ref 22–35)
T4, Total: 6.1 ug/dL (ref 4.9–10.5)
TSH: 1.49 mIU/L (ref 0.40–4.50)

## 2020-12-25 LAB — VITAMIN B12: Vitamin B-12: 201 pg/mL (ref 200–1100)

## 2020-12-30 DIAGNOSIS — F419 Anxiety disorder, unspecified: Secondary | ICD-10-CM | POA: Diagnosis not present

## 2020-12-30 DIAGNOSIS — F633 Trichotillomania: Secondary | ICD-10-CM | POA: Diagnosis not present

## 2020-12-30 DIAGNOSIS — F331 Major depressive disorder, recurrent, moderate: Secondary | ICD-10-CM | POA: Diagnosis not present

## 2020-12-31 ENCOUNTER — Emergency Department: Payer: BC Managed Care – PPO

## 2020-12-31 ENCOUNTER — Ambulatory Visit: Payer: BC Managed Care – PPO | Admitting: Family Medicine

## 2020-12-31 ENCOUNTER — Encounter: Payer: Self-pay | Admitting: Radiology

## 2020-12-31 ENCOUNTER — Other Ambulatory Visit: Payer: Self-pay

## 2020-12-31 ENCOUNTER — Emergency Department
Admission: EM | Admit: 2020-12-31 | Discharge: 2020-12-31 | Disposition: A | Discharge status: Home / Self Care | Payer: BC Managed Care – PPO | Attending: Emergency Medicine | Admitting: Emergency Medicine

## 2020-12-31 DIAGNOSIS — I10 Essential (primary) hypertension: Secondary | ICD-10-CM | POA: Diagnosis not present

## 2020-12-31 DIAGNOSIS — U071 COVID-19: Secondary | ICD-10-CM | POA: Diagnosis not present

## 2020-12-31 DIAGNOSIS — R5383 Other fatigue: Secondary | ICD-10-CM | POA: Diagnosis not present

## 2020-12-31 DIAGNOSIS — Z79899 Other long term (current) drug therapy: Secondary | ICD-10-CM | POA: Diagnosis not present

## 2020-12-31 DIAGNOSIS — R509 Fever, unspecified: Secondary | ICD-10-CM | POA: Diagnosis not present

## 2020-12-31 DIAGNOSIS — R0789 Other chest pain: Secondary | ICD-10-CM | POA: Diagnosis not present

## 2020-12-31 DIAGNOSIS — R0602 Shortness of breath: Secondary | ICD-10-CM | POA: Diagnosis not present

## 2020-12-31 LAB — CBC WITH DIFFERENTIAL/PLATELET
Abs Immature Granulocytes: 0.01 10*3/uL (ref 0.00–0.07)
Basophils Absolute: 0 10*3/uL (ref 0.0–0.1)
Basophils Relative: 0 %
Eosinophils Absolute: 0 10*3/uL (ref 0.0–0.5)
Eosinophils Relative: 0 %
HCT: 36.2 % — ABNORMAL LOW (ref 39.0–52.0)
Hemoglobin: 12.5 g/dL — ABNORMAL LOW (ref 13.0–17.0)
Immature Granulocytes: 0 %
Lymphocytes Relative: 11 %
Lymphs Abs: 0.4 10*3/uL — ABNORMAL LOW (ref 0.7–4.0)
MCH: 31.9 pg (ref 26.0–34.0)
MCHC: 34.5 g/dL (ref 30.0–36.0)
MCV: 92.3 fL (ref 80.0–100.0)
Monocytes Absolute: 0.7 10*3/uL (ref 0.1–1.0)
Monocytes Relative: 18 %
Neutro Abs: 2.6 10*3/uL (ref 1.7–7.7)
Neutrophils Relative %: 71 %
Platelets: 210 10*3/uL (ref 150–400)
RBC: 3.92 MIL/uL — ABNORMAL LOW (ref 4.22–5.81)
RDW: 11.7 % (ref 11.5–15.5)
WBC: 3.7 10*3/uL — ABNORMAL LOW (ref 4.0–10.5)
nRBC: 0 % (ref 0.0–0.2)

## 2020-12-31 LAB — COMPREHENSIVE METABOLIC PANEL
ALT: 26 U/L (ref 0–44)
AST: 27 U/L (ref 15–41)
Albumin: 4.5 g/dL (ref 3.5–5.0)
Alkaline Phosphatase: 48 U/L (ref 38–126)
Anion gap: 6 (ref 5–15)
BUN: 11 mg/dL (ref 6–20)
CO2: 24 mmol/L (ref 22–32)
Calcium: 9.4 mg/dL (ref 8.9–10.3)
Chloride: 102 mmol/L (ref 98–111)
Creatinine, Ser: 0.93 mg/dL (ref 0.61–1.24)
GFR, Estimated: >60 mL/min (ref >60)
Glucose, Bld: 115 mg/dL — ABNORMAL HIGH (ref 70–99)
Potassium: 3.1 mmol/L — ABNORMAL LOW (ref 3.5–5.1)
Sodium: 132 mmol/L — ABNORMAL LOW (ref 135–145)
Total Bilirubin: 0.7 mg/dL (ref 0.3–1.2)
Total Protein: 7.6 g/dL (ref 6.5–8.1)

## 2020-12-31 LAB — TROPONIN I (HIGH SENSITIVITY)
Troponin I (High Sensitivity): 3 ng/L (ref <18)
Troponin I (High Sensitivity): <2 ng/L (ref <18)

## 2020-12-31 MED ORDER — NIRMATRELVIR/RITONAVIR (PAXLOVID)TABLET: 3.0000 | ORAL_TABLET | Freq: Two times a day (BID) | ORAL | 0 refills | Status: AC

## 2020-12-31 NOTE — ED Triage Notes (Signed)
Patient ambulatory to triage with steady gait, without difficulty or distress noted; pt reports +home COVID test on Saturday; c/o SHOB, fever and chest tightness

## 2020-12-31 NOTE — ED Provider Notes (Signed)
[facility] Emergency Department Provider Note   ____________________________________________    I have reviewed the triage vital signs and the nursing notes.   HISTORY  Chief Complaint Shortness of Breath     HPI Cole Everett is a 35 y.o. male who presents with complaints of chest burning, mild cough, fatigue, body aches.  Patient reports he tested positive for COVID 3 days ago.  He does have a history of Crohn's disease and does take Cimzia.  He has been vaccinated but not boosted.  Has been taking Tylenol for fever.  Family members also have [place].  Past Medical History:  Diagnosis Date   Allergy to alpha-gal    Anxiety    Crohn disease ([place])    GERD (gastroesophageal reflux disease)    Hypertension    Lumbar disc disease    Seborrheic dermatitis     Patient Active Problem List   Diagnosis Date Noted   Scalp psoriasis 09/24/2020   Moderate recurrent major depression ([place]) 08/13/2020   Generalized anxiety disorder with panic attacks 08/13/2020   Chronic bilateral low back pain with right-sided sciatica 08/13/2020   Lumbar disc disease 06/20/2015   Gastroesophageal reflux disease with esophagitis 06/20/2015   Crohn's disease of large intestine without complication ([place]) 23/30/0762    Past Surgical History:  Procedure Laterality Date   BUNIONECTOMY     FOOT SURGERY      Prior to Admission medications   Medication Sig Start Date End Date Taking? Authorizing Provider  nirmatrelvir/ritonavir EUA (PAXLOVID) TABS Take 3 tablets by mouth 2 (two) times daily for 5 days. Patient GFR is 60. Take nirmatrelvir (150 mg) two tablets twice daily for 5 days and ritonavir (100 mg) one tablet twice daily for 5 days. 12/31/20 01/05/21 Yes Lavonia Drafts, MD  buPROPion (WELLBUTRIN XL) 300 MG 24 hr tablet Take 300 mg by mouth daily. 06/03/20   [provider]  CIMZIA PREFILLED 2 X 200 MG/ML KIT every 30 (thirty) days. For Crohn's 10/03/18   [provider]  cloNIDine (CATAPRES) 0.1 MG tablet Take 2 tablets by mouth at bedtime. 05/15/20   [provider]  FLUoxetine (PROZAC) 20 MG capsule Take 20 mg by mouth daily. 07/24/20   [provider]     Allergies Beef-derived products and Penicillins  Family History  Problem Relation Age of Onset   Irritable bowel syndrome Mother    Hypertension Mother    Heart disease Mother    Cirrhosis Father    Liver cancer Father    Thyroid disease Sister    Alzheimer's disease Paternal Grandmother     Social History Social History   Tobacco Use   Smoking status: Never   Smokeless tobacco: Never  Vaping Use   Vaping Use: Never used  Substance Use Topics   Alcohol use: Yes    Alcohol/week: 1.0 standard drink    Types: 1 Glasses of wine per week    Comment: occasionally   Drug use: Yes    Types: Marijuana    Comment: last use last night    Review of Systems  Constitutional: Positive fevers Eyes: No visual changes.  ENT: Mild sore throat Cardiovascular: As above Respiratory: Denies shortness of breath. Gastrointestinal: No abdominal pain.  No nausea, no vomiting.   Genitourinary: Negative for dysuria. Musculoskeletal: Myalgias Skin: Negative for rash. Neurological: Negative for weakness, positive for headache   ____________________________________________   PHYSICAL EXAM:  VITAL SIGNS: ED Triage Vitals  Enc Vitals Group  BP 12/31/20 0431 (!) 147/91     Pulse Rate 12/31/20 0431 (!) 105     Resp 12/31/20 0431 18     Temp 12/31/20 0431 100 F (37.8 C)     Temp Source 12/31/20 0431 Oral     SpO2 12/31/20 0431 99 %     Weight 12/31/20 0433 77.1 kg (170 lb)     Height 12/31/20 0433 1.778 m (5' 10" )     Head Circumference --      Peak Flow --      Pain Score 12/31/20 0432 8     Pain Loc --      Pain Edu? --      Excl. in [place]? --     Constitutional: Alert and oriented.   Nose: No congestion/rhinnorhea. Mouth/Throat: Mucous membranes are  moist.   Neck:  Painless ROM Cardiovascular: Normal rate, regular rhythm.   Good peripheral circulation. Respiratory: Normal respiratory effort.  No retractions. Lungs CTAB. Gastrointestinal: Soft and nontender. No distention.    Musculoskeletal: No lower extremity tenderness nor edema.  Warm and well perfused Neurologic:  Normal speech and language. No gross focal neurologic deficits are appreciated.  Skin:  Skin is warm, dry and intact. No rash noted. Psychiatric: Mood and affect are normal. Speech and behavior are normal.  ____________________________________________   LABS (all labs ordered are listed, but only abnormal results are displayed)  Labs Reviewed  CBC WITH DIFFERENTIAL/PLATELET - Abnormal; Notable for the following components:      Result Value   WBC 3.7 (*)    RBC 3.92 (*)    Hemoglobin 12.5 (*)    HCT 36.2 (*)    Lymphs Abs 0.4 (*)    All other components within normal limits  COMPREHENSIVE METABOLIC PANEL - Abnormal; Notable for the following components:   Sodium 132 (*)    Potassium 3.1 (*)    Glucose, Bld 115 (*)    All other components within normal limits  TROPONIN I (HIGH SENSITIVITY)  TROPONIN I (HIGH SENSITIVITY)   ____________________________________________  EKG  ED ECG REPORT I, Lavonia Drafts, the attending physician, personally viewed and interpreted this ECG.  Date: 12/31/2020  Rhythm: normal sinus rhythm QRS Axis: normal Intervals: normal ST/T Wave abnormalities: normal Narrative Interpretation: no evidence of acute ischemia  ____________________________________________  RADIOLOGY  Chest x-ray viewed by me, no acute abnormality ____________________________________________   PROCEDURES  Procedure(s) performed: No  Procedures   Critical Care performed: No ____________________________________________   INITIAL IMPRESSION / ASSESSMENT AND PLAN / ED COURSE  Pertinent labs & imaging results that were available during my  care of the patient were reviewed by me and considered in my medical decision making (see chart for details).   Patient overall well-appearing and in no acute distress, diagnosed with COVID via home test 3 days ago.  Does have a history of Crohn's disease and is immunocompromised.  Lab work is overall reassuring, chest x-ray without evidence of pneumonia.  Delta troponin normal.  EKG normal.  No pleurisy, he does not complain of shortness of breath to me.  Within the window for paxlovid, will prescribe, return precautions discussed    ____________________________________________   FINAL CLINICAL IMPRESSION(S) / ED DIAGNOSES  Final diagnoses:  COVID-19        Note:  This document was prepared using Dragon voice recognition software and may include unintentional dictation errors.    Lavonia Drafts, MD 12/31/20 [phone]

## 2021-01-06 DIAGNOSIS — F4310 Post-traumatic stress disorder, unspecified: Secondary | ICD-10-CM | POA: Diagnosis not present

## 2021-01-22 DIAGNOSIS — F4310 Post-traumatic stress disorder, unspecified: Secondary | ICD-10-CM | POA: Diagnosis not present

## 2021-02-06 DIAGNOSIS — F4310 Post-traumatic stress disorder, unspecified: Secondary | ICD-10-CM | POA: Diagnosis not present

## 2021-02-11 DIAGNOSIS — F633 Trichotillomania: Secondary | ICD-10-CM | POA: Diagnosis not present

## 2021-02-11 DIAGNOSIS — F331 Major depressive disorder, recurrent, moderate: Secondary | ICD-10-CM | POA: Diagnosis not present

## 2021-02-11 DIAGNOSIS — F419 Anxiety disorder, unspecified: Secondary | ICD-10-CM | POA: Diagnosis not present

## 2021-02-20 DIAGNOSIS — F4310 Post-traumatic stress disorder, unspecified: Secondary | ICD-10-CM | POA: Diagnosis not present

## 2021-03-12 DIAGNOSIS — F4310 Post-traumatic stress disorder, unspecified: Secondary | ICD-10-CM | POA: Diagnosis not present

## 2021-03-12 DIAGNOSIS — F633 Trichotillomania: Secondary | ICD-10-CM | POA: Diagnosis not present

## 2021-03-12 DIAGNOSIS — F419 Anxiety disorder, unspecified: Secondary | ICD-10-CM | POA: Diagnosis not present

## 2021-03-12 DIAGNOSIS — F331 Major depressive disorder, recurrent, moderate: Secondary | ICD-10-CM | POA: Diagnosis not present

## 2021-03-19 DIAGNOSIS — F4310 Post-traumatic stress disorder, unspecified: Secondary | ICD-10-CM | POA: Diagnosis not present

## 2021-03-27 DIAGNOSIS — F4310 Post-traumatic stress disorder, unspecified: Secondary | ICD-10-CM | POA: Diagnosis not present

## 2021-04-03 DIAGNOSIS — F4310 Post-traumatic stress disorder, unspecified: Secondary | ICD-10-CM | POA: Diagnosis not present

## 2021-04-10 DIAGNOSIS — F4310 Post-traumatic stress disorder, unspecified: Secondary | ICD-10-CM | POA: Diagnosis not present

## 2021-04-17 DIAGNOSIS — F4310 Post-traumatic stress disorder, unspecified: Secondary | ICD-10-CM | POA: Diagnosis not present

## 2021-04-24 DIAGNOSIS — F4310 Post-traumatic stress disorder, unspecified: Secondary | ICD-10-CM | POA: Diagnosis not present

## 2021-05-01 DIAGNOSIS — F4310 Post-traumatic stress disorder, unspecified: Secondary | ICD-10-CM | POA: Diagnosis not present

## 2021-05-08 DIAGNOSIS — F4310 Post-traumatic stress disorder, unspecified: Secondary | ICD-10-CM | POA: Diagnosis not present

## 2021-05-15 DIAGNOSIS — F4310 Post-traumatic stress disorder, unspecified: Secondary | ICD-10-CM | POA: Diagnosis not present

## 2021-05-22 DIAGNOSIS — F4310 Post-traumatic stress disorder, unspecified: Secondary | ICD-10-CM | POA: Diagnosis not present

## 2021-05-29 DIAGNOSIS — F4310 Post-traumatic stress disorder, unspecified: Secondary | ICD-10-CM | POA: Diagnosis not present

## 2021-06-04 DIAGNOSIS — F331 Major depressive disorder, recurrent, moderate: Secondary | ICD-10-CM | POA: Diagnosis not present

## 2021-06-04 DIAGNOSIS — F419 Anxiety disorder, unspecified: Secondary | ICD-10-CM | POA: Diagnosis not present

## 2021-06-04 DIAGNOSIS — F4310 Post-traumatic stress disorder, unspecified: Secondary | ICD-10-CM | POA: Diagnosis not present

## 2021-06-04 DIAGNOSIS — F633 Trichotillomania: Secondary | ICD-10-CM | POA: Diagnosis not present

## 2021-06-05 DIAGNOSIS — F4310 Post-traumatic stress disorder, unspecified: Secondary | ICD-10-CM | POA: Diagnosis not present

## 2021-06-11 DIAGNOSIS — F4310 Post-traumatic stress disorder, unspecified: Secondary | ICD-10-CM | POA: Diagnosis not present

## 2021-06-24 DIAGNOSIS — H01134 Eczematous dermatitis of left upper eyelid: Secondary | ICD-10-CM | POA: Diagnosis not present

## 2021-06-24 DIAGNOSIS — H01131 Eczematous dermatitis of right upper eyelid: Secondary | ICD-10-CM | POA: Diagnosis not present

## 2021-06-24 DIAGNOSIS — L400 Psoriasis vulgaris: Secondary | ICD-10-CM | POA: Diagnosis not present

## 2021-06-24 DIAGNOSIS — D225 Melanocytic nevi of trunk: Secondary | ICD-10-CM | POA: Diagnosis not present

## 2021-06-26 DIAGNOSIS — F4310 Post-traumatic stress disorder, unspecified: Secondary | ICD-10-CM | POA: Diagnosis not present

## 2021-07-03 DIAGNOSIS — F4310 Post-traumatic stress disorder, unspecified: Secondary | ICD-10-CM | POA: Diagnosis not present

## 2021-07-08 ENCOUNTER — Telehealth (INDEPENDENT_AMBULATORY_CARE_PROVIDER_SITE_OTHER): Payer: BC Managed Care – PPO | Admitting: Family Medicine

## 2021-07-08 ENCOUNTER — Encounter: Payer: Self-pay | Admitting: Family Medicine

## 2021-07-08 VITALS — Ht 70.0 in | Wt 170.0 lb

## 2021-07-08 DIAGNOSIS — J310 Chronic rhinitis: Secondary | ICD-10-CM | POA: Diagnosis not present

## 2021-07-08 DIAGNOSIS — J0100 Acute maxillary sinusitis, unspecified: Secondary | ICD-10-CM | POA: Diagnosis not present

## 2021-07-08 DIAGNOSIS — T485X5A Adverse effect of other anti-common-cold drugs, initial encounter: Secondary | ICD-10-CM

## 2021-07-08 MED ORDER — DOXYCYCLINE HYCLATE 100 MG PO TABS: 100.0000 mg | ORAL_TABLET | Freq: Two times a day (BID) | ORAL | 0 refills | Status: DC

## 2021-07-08 MED ORDER — PREDNISONE 20 MG PO TABS: ORAL_TABLET | ORAL | 0 refills | Status: DC

## 2021-07-08 MED ORDER — FLUTICASONE PROPIONATE 50 MCG/ACT NA SUSP: 2.0000 | Freq: Every day | NASAL | 3 refills | Status: DC

## 2021-07-08 NOTE — Progress Notes (Signed)
Virtual Visit via Telephone The purpose of this virtual visit is to provide medical care while limiting exposure to the novel coronavirus (COVID19) for both patient and office staff.  Consent was obtained for phone visit:  Yes.   Answered questions that patient had about telehealth interaction:  Yes.   I discussed the limitations, risks, security and privacy concerns of performing an evaluation and management service by telephone. I also discussed with the patient that there may be a patient responsible charge related to this service. The patient expressed understanding and agreed to proceed.  Patient Location: Home Provider Location: [facility] (Office)  Participants in virtual visit: - Patient: Cole Everett - CMA: Orinda Kenner, CMA - Provider: Dr Parks Ranger  ---------------------------------------------------------------------- Chief Complaint  Patient presents with   Cough   sinus pressure    S: Reviewed CMA documentation. I have called patient and gathered additional HPI as follows:  Sinusitis Reports that symptoms started 2 weeks ago with sinus pain pressure productive mucus and productive cough in AM, post nasal drainage, Left sided sinus pressure sore to touch on face. - Tried OTC nasal spray Sinex Vicks with oxymetazalone, works temporarily then worsen Not on nasal steroid. PCN Allergy, has taken Doxy before Has Crohn's but can take prednisone PRN. On Cimzia says maybe immune system weaker   Denies any fevers, chills, sweats, body ache, shortness of breath, headache, abdominal pain, diarrhea  Past Medical History:  Diagnosis Date   Allergy to alpha-gal    Anxiety    Crohn disease ([place])    GERD (gastroesophageal reflux disease)    Hypertension    Lumbar disc disease    Seborrheic dermatitis    Social History   Tobacco Use   Smoking status: Never   Smokeless tobacco: Never  Vaping Use   Vaping Use: Never used  Substance Use Topics    Alcohol use: Yes    Alcohol/week: 1.0 standard drink    Types: 1 Glasses of wine per week    Comment: occasionally   Drug use: Yes    Types: Marijuana    Comment: last use last night    Current Outpatient Medications:    buPROPion (WELLBUTRIN XL) 300 MG 24 hr tablet, Take 300 mg by mouth daily., Disp: , Rfl:    CIMZIA PREFILLED 2 X 200 MG/ML KIT, every 30 (thirty) days. For Crohn's, Disp: , Rfl:    cloNIDine (CATAPRES) 0.1 MG tablet, Take 2 tablets by mouth at bedtime., Disp: , Rfl:    doxycycline (VIBRA-TABS) 100 MG tablet, Take 1 tablet (100 mg total) by mouth 2 (two) times daily. For 10 days. Take with full glass of water, stay upright 30 min after taking., Disp: 20 tablet, Rfl: 0   FLUoxetine (PROZAC) 20 MG capsule, Take 20 mg by mouth daily., Disp: , Rfl:    fluticasone (FLONASE) 50 MCG/ACT nasal spray, Place 2 sprays into both nostrils daily. Use for 4-6 weeks then stop and use seasonally or as needed., Disp: 16 g, Rfl: 3   predniSONE (DELTASONE) 20 MG tablet, Take 9m (2 tabs) for 3 days, then 255m(1 tab) daily for remaining 4 days., Disp: 10 tablet, Rfl: 0  [facility] 09/24/2020 08/13/2020  Decreased Interest 0 2  Down, Depressed, Hopeless 1 1  PHQ - 2 Score 1 3  Altered sleeping 2 1  Tired, decreased energy 3 3  Change in appetite 2 2  Feeling bad or failure about yourself  2 2  Trouble  concentrating 2 0  Moving slowly or fidgety/restless 1 1  Suicidal thoughts 1 0  PHQ-9 Score 14 12  Difficult doing work/chores Not difficult at all Somewhat difficult    GAD 7 : Generalized Anxiety Score 09/24/2020 08/13/2020  Nervous, Anxious, on Edge 3 2  Control/stop worrying 2 2  Worry too much - different things 2 3  Trouble relaxing 2 2  Restless 2 3  Easily annoyed or irritable 2 1  Afraid - awful might happen 1 1  Total GAD 7 Score 14 14  Anxiety Difficulty Somewhat difficult Somewhat difficult     -------------------------------------------------------------------------- O: No physical exam performed due to remote telephone encounter.  Lab results reviewed.  No results found for this or any previous visit (from the past 2160 hour(s)).  -------------------------------------------------------------------------- A&P:  Problem List Items Addressed This Visit   None Visit Diagnoses     Acute non-recurrent maxillary sinusitis    -  Primary   Relevant Medications   doxycycline (VIBRA-TABS) 100 MG tablet   fluticasone (FLONASE) 50 MCG/ACT nasal spray   predniSONE (DELTASONE) 20 MG tablet   Rhinitis medicamentosa       Relevant Medications   fluticasone (FLONASE) 50 MCG/ACT nasal spray      Consistent with acute maxillary sinusitis, likely initially viral URI vs allergic rhinitis component with worsening concern for bacterial infection.   Plan: PCN Allergy > Start taking Doxycycline antibiotic 167m twice daily for 10 days. Take with full glass of water and stay upright for at least 30 min after taking, may be seated or standing, but should NOT lay down. This is just a safety precaution, if this medicine does not go all the way down throat well it could cause some burning discomfort to throat and esophagus. DC Sinex (Oxymetazalone spray) - can use SPARINGLY 1 x per day ONLY IF NEED, discussed rebound rhinitis medicamentosa Start nasal steroid Flonase 2 sprays in each nostril daily for 4-6 weeks, may repeat course seasonally or as needed Prednisone taper 7 day OTC other medications PRN Follow up as need  Meds ordered this encounter  Medications   doxycycline (VIBRA-TABS) 100 MG tablet    Sig: Take 1 tablet (100 mg total) by mouth 2 (two) times daily. For 10 days. Take with full glass of water, stay upright 30 min after taking.    Dispense:  20 tablet    Refill:  0   fluticasone (FLONASE) 50 MCG/ACT nasal spray    Sig: Place 2 sprays into both nostrils daily. Use for 4-6  weeks then stop and use seasonally or as needed.    Dispense:  16 g    Refill:  3   predniSONE (DELTASONE) 20 MG tablet    Sig: Take 429m(2 tabs) for 3 days, then 2067m1 tab) daily for remaining 4 days.    Dispense:  10 tablet    Refill:  0    Follow-up: - Return in 1 week PRN  Patient verbalizes understanding with the above medical recommendations including the limitation of remote medical advice.  Specific follow-up and call-back criteria were given for patient to follow-up or seek medical care more urgently if needed.   - Time spent in direct consultation with patient on phone: 10 minutes   AleNobie PutnamO [place] 07/08/2021, 11:50 AM

## 2021-07-08 NOTE — Patient Instructions (Addendum)
PCN Allergy > Start taking Doxycycline antibiotic 100mg  twice daily for 10 days. Take with full glass of water and stay upright for at least 30 min after taking, may be seated or standing, but should NOT lay down. This is just a safety precaution, if this medicine does not go all the way down throat well it could cause some burning discomfort to throat and esophagus. DC Sinex (Oxymetazalone spray) - can use SPARINGLY 1 x per day ONLY IF NEED, discussed rebound rhinitis medicamentosa Start nasal steroid Flonase 2 sprays in each nostril daily for 4-6 weeks, may repeat course seasonally or as needed Prednisone taper 7 day OTC other medications PRN Follow up as need  Please schedule a Follow-up Appointment to: Return if symptoms worsen or fail to improve.  If you have any other questions or concerns, please feel free to call the office or send a message through MyChart. You may also schedule an earlier appointment if necessary.  Additionally, you may be receiving a survey about your experience at our office within a few days to 1 week by e-mail or mail. We value your feedback.  , DO [facility], [facility]

## 2021-07-24 DIAGNOSIS — F4310 Post-traumatic stress disorder, unspecified: Secondary | ICD-10-CM | POA: Diagnosis not present

## 2021-08-07 DIAGNOSIS — F4310 Post-traumatic stress disorder, unspecified: Secondary | ICD-10-CM | POA: Diagnosis not present

## 2021-08-21 DIAGNOSIS — F4310 Post-traumatic stress disorder, unspecified: Secondary | ICD-10-CM | POA: Diagnosis not present

## 2021-08-27 DIAGNOSIS — F419 Anxiety disorder, unspecified: Secondary | ICD-10-CM | POA: Diagnosis not present

## 2021-08-27 DIAGNOSIS — F3341 Major depressive disorder, recurrent, in partial remission: Secondary | ICD-10-CM | POA: Diagnosis not present

## 2021-08-27 DIAGNOSIS — F633 Trichotillomania: Secondary | ICD-10-CM | POA: Diagnosis not present

## 2021-08-27 DIAGNOSIS — F4312 Post-traumatic stress disorder, chronic: Secondary | ICD-10-CM | POA: Diagnosis not present

## 2021-08-28 DIAGNOSIS — F4310 Post-traumatic stress disorder, unspecified: Secondary | ICD-10-CM | POA: Diagnosis not present

## 2021-09-04 DIAGNOSIS — F4310 Post-traumatic stress disorder, unspecified: Secondary | ICD-10-CM | POA: Diagnosis not present

## 2021-09-18 DIAGNOSIS — F4310 Post-traumatic stress disorder, unspecified: Secondary | ICD-10-CM | POA: Diagnosis not present

## 2021-10-02 DIAGNOSIS — F4310 Post-traumatic stress disorder, unspecified: Secondary | ICD-10-CM | POA: Diagnosis not present

## 2021-10-22 DIAGNOSIS — F4310 Post-traumatic stress disorder, unspecified: Secondary | ICD-10-CM | POA: Diagnosis not present

## 2021-10-30 DIAGNOSIS — F4310 Post-traumatic stress disorder, unspecified: Secondary | ICD-10-CM | POA: Diagnosis not present

## 2021-11-05 DIAGNOSIS — F4310 Post-traumatic stress disorder, unspecified: Secondary | ICD-10-CM | POA: Diagnosis not present

## 2021-11-13 DIAGNOSIS — F4310 Post-traumatic stress disorder, unspecified: Secondary | ICD-10-CM | POA: Diagnosis not present

## 2021-11-20 DIAGNOSIS — F4310 Post-traumatic stress disorder, unspecified: Secondary | ICD-10-CM | POA: Diagnosis not present

## 2021-11-27 DIAGNOSIS — F4310 Post-traumatic stress disorder, unspecified: Secondary | ICD-10-CM | POA: Diagnosis not present

## 2021-12-04 DIAGNOSIS — F4310 Post-traumatic stress disorder, unspecified: Secondary | ICD-10-CM | POA: Diagnosis not present

## 2021-12-18 DIAGNOSIS — F4310 Post-traumatic stress disorder, unspecified: Secondary | ICD-10-CM | POA: Diagnosis not present

## 2022-01-08 DIAGNOSIS — F4310 Post-traumatic stress disorder, unspecified: Secondary | ICD-10-CM | POA: Diagnosis not present

## 2022-01-15 DIAGNOSIS — F4310 Post-traumatic stress disorder, unspecified: Secondary | ICD-10-CM | POA: Diagnosis not present

## 2022-01-22 DIAGNOSIS — F4310 Post-traumatic stress disorder, unspecified: Secondary | ICD-10-CM | POA: Diagnosis not present

## 2022-01-29 DIAGNOSIS — F4310 Post-traumatic stress disorder, unspecified: Secondary | ICD-10-CM | POA: Diagnosis not present

## 2022-02-05 DIAGNOSIS — F4310 Post-traumatic stress disorder, unspecified: Secondary | ICD-10-CM | POA: Diagnosis not present

## 2022-02-19 DIAGNOSIS — F4310 Post-traumatic stress disorder, unspecified: Secondary | ICD-10-CM | POA: Diagnosis not present

## 2022-02-26 DIAGNOSIS — F4310 Post-traumatic stress disorder, unspecified: Secondary | ICD-10-CM | POA: Diagnosis not present

## 2022-03-02 IMAGING — CR DG CHEST 2V
2 series · 2 of 2 positions shown · non-contrast
Comparison: CT Abdomen and Pelvis 12/24/2014.

CLINICAL DATA: 35-year-old male tested positive for [id number] 3 days
ago. Shortness of breath, fever and chest tightness.

EXAM:
CHEST - 2 VIEW

[chest pa]
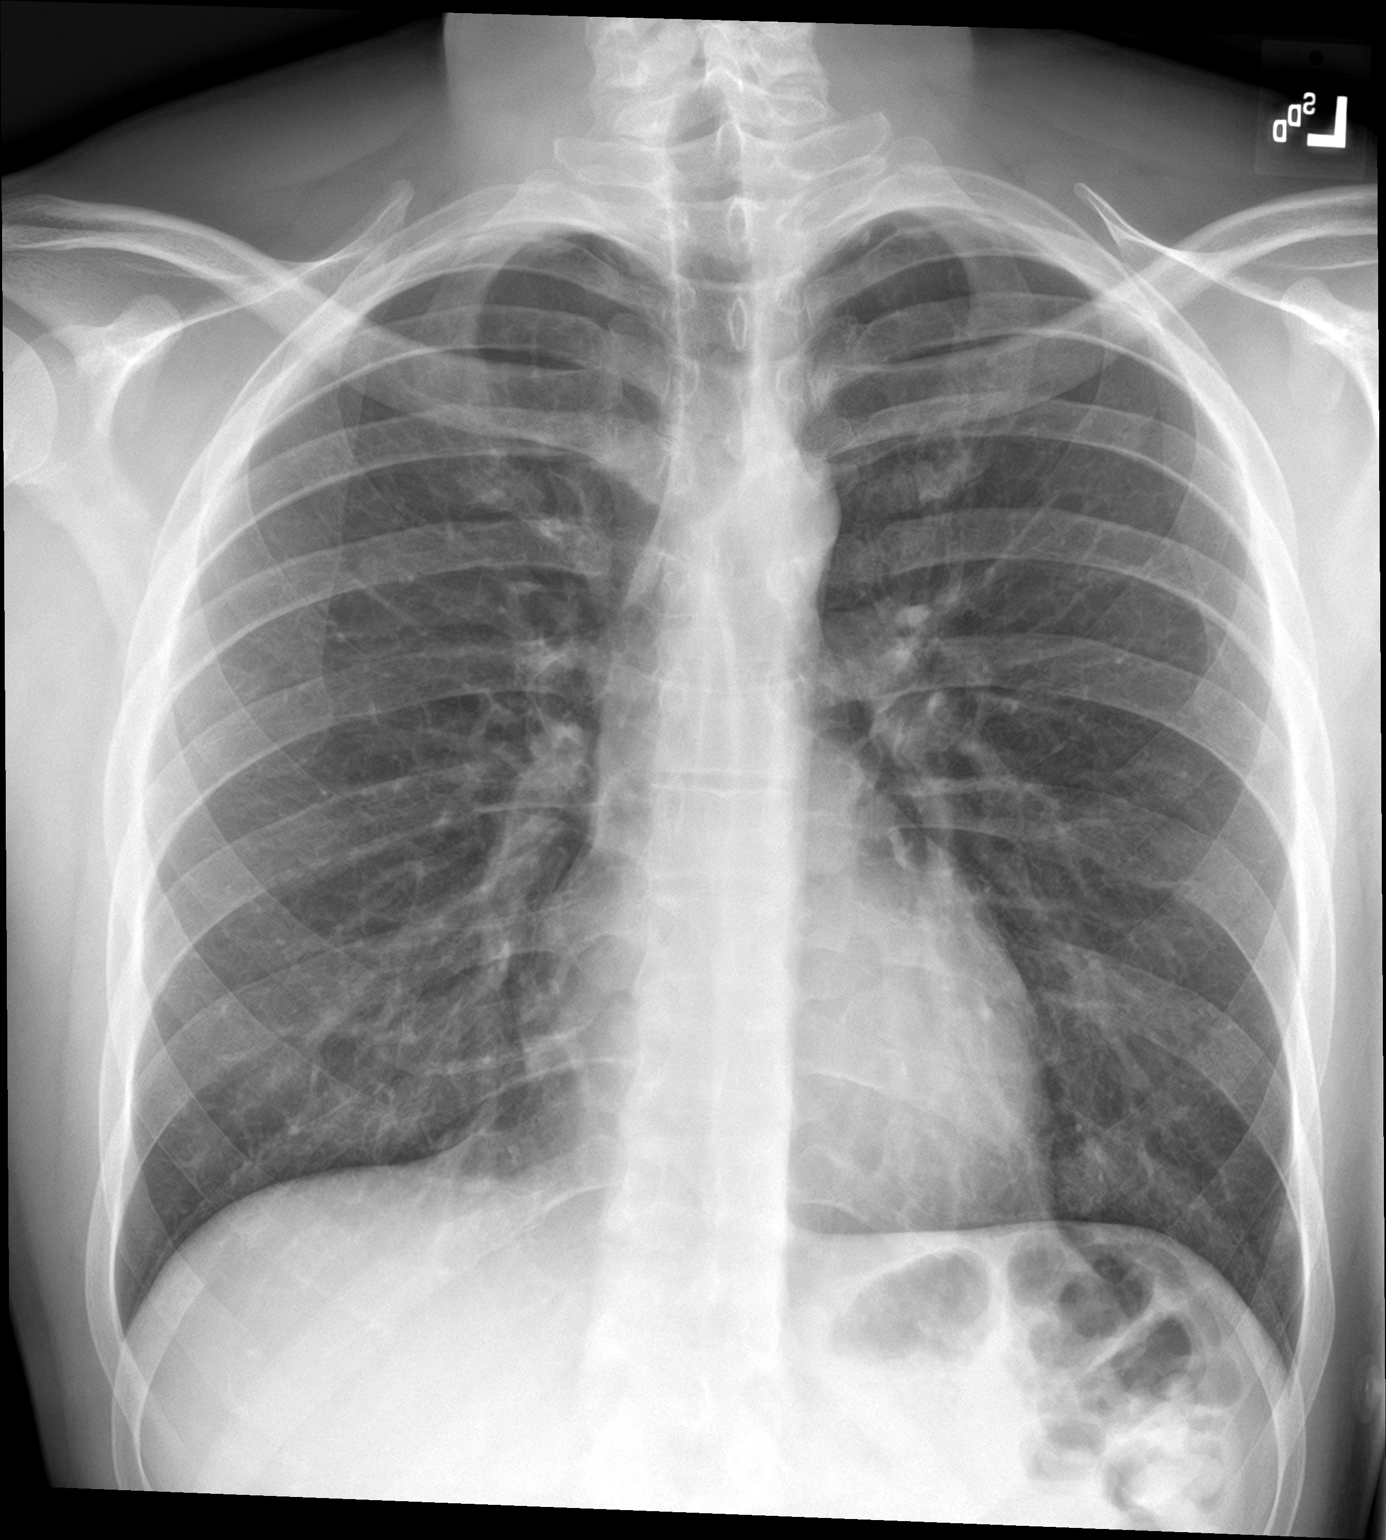

[chest lat]
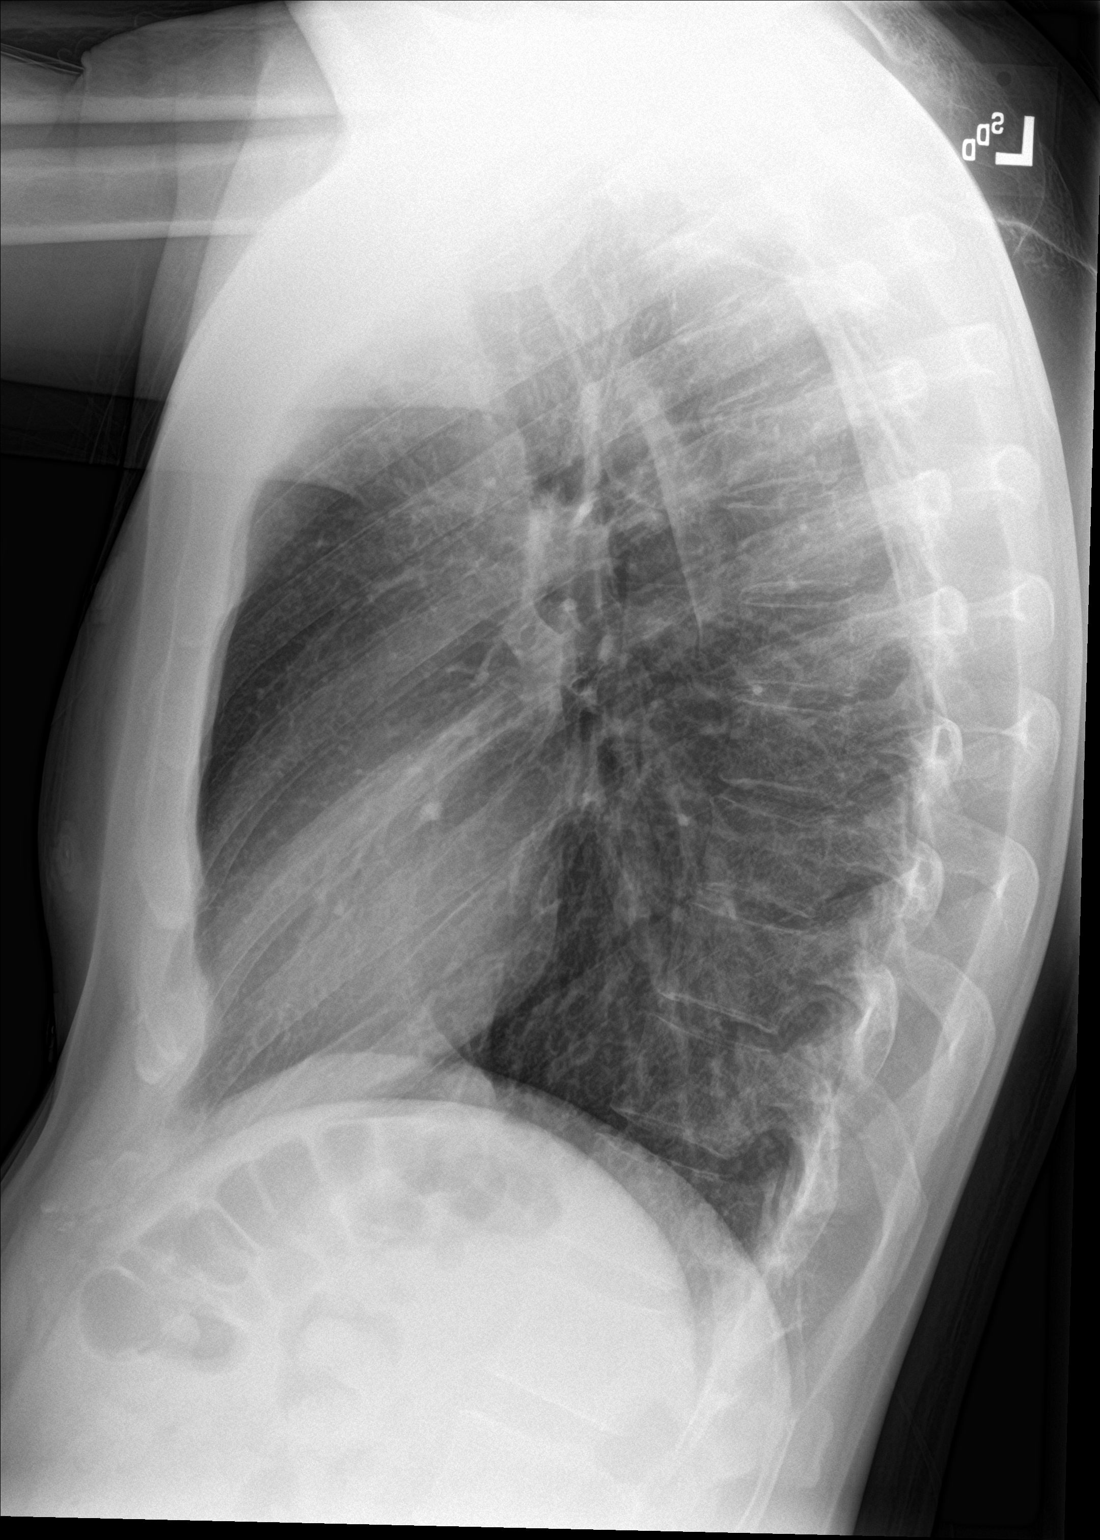

[2 of 2 positions shown; findings below may reference images not displayed]

FINDINGS: Normal lung volumes and mediastinal contours. Visualized tracheal
air column is within normal limits. Lung markings are within normal
limits. No pneumothorax, pulmonary edema, pleural effusion or
confluent pulmonary opacity.

No osseous abnormality identified. Negative visible bowel gas
pattern.
IMPRESSION: No acute cardiopulmonary abnormality.

## 2022-03-05 DIAGNOSIS — F4310 Post-traumatic stress disorder, unspecified: Secondary | ICD-10-CM | POA: Diagnosis not present

## 2022-03-12 DIAGNOSIS — F4310 Post-traumatic stress disorder, unspecified: Secondary | ICD-10-CM | POA: Diagnosis not present

## 2022-03-19 DIAGNOSIS — F4310 Post-traumatic stress disorder, unspecified: Secondary | ICD-10-CM | POA: Diagnosis not present

## 2022-03-26 DIAGNOSIS — F4310 Post-traumatic stress disorder, unspecified: Secondary | ICD-10-CM | POA: Diagnosis not present

## 2022-04-15 DIAGNOSIS — U071 COVID-19: Secondary | ICD-10-CM | POA: Diagnosis not present

## 2022-04-15 DIAGNOSIS — R509 Fever, unspecified: Secondary | ICD-10-CM | POA: Diagnosis not present

## 2022-04-23 DIAGNOSIS — F4310 Post-traumatic stress disorder, unspecified: Secondary | ICD-10-CM | POA: Diagnosis not present

## 2022-04-30 DIAGNOSIS — F4310 Post-traumatic stress disorder, unspecified: Secondary | ICD-10-CM | POA: Diagnosis not present

## 2022-05-05 ENCOUNTER — Other Ambulatory Visit: Payer: Self-pay | Admitting: Family Medicine

## 2022-05-05 DIAGNOSIS — T485X5A Adverse effect of other anti-common-cold drugs, initial encounter: Secondary | ICD-10-CM

## 2022-05-05 DIAGNOSIS — J0100 Acute maxillary sinusitis, unspecified: Secondary | ICD-10-CM

## 2022-05-05 NOTE — Telephone Encounter (Signed)
Requested Prescriptions  Pending Prescriptions Disp Refills  . fluticasone (FLONASE) 50 MCG/ACT nasal spray [Pharmacy Med Name: FLUTICASONE PROP 50 MCG SPRAY] 16 mL 3    Sig: PLACE 2 SPRAYS INTO BOTH NOSTRILS DAILY. USE FOR 4-6 WEEKS THEN STOP AND USE SEASONALLY OR AS NEEDED     Ear, Nose, and Throat: Nasal Preparations - Corticosteroids Failed - 05/05/2022  7:31 AM      Failed - Valid encounter within last 12 months    Recent Outpatient Visits          10 months ago Acute non-recurrent maxillary sinusitis   [facility] Olin Hauser, DO   1 year ago Annual physical exam   [place], DO   1 year ago Moderate recurrent major depression [facility])   [place], Devonne Doughty, DO

## 2022-05-07 DIAGNOSIS — F4310 Post-traumatic stress disorder, unspecified: Secondary | ICD-10-CM | POA: Diagnosis not present

## 2022-05-14 DIAGNOSIS — F4310 Post-traumatic stress disorder, unspecified: Secondary | ICD-10-CM | POA: Diagnosis not present

## 2022-05-21 DIAGNOSIS — F4310 Post-traumatic stress disorder, unspecified: Secondary | ICD-10-CM | POA: Diagnosis not present

## 2022-06-30 ENCOUNTER — Other Ambulatory Visit: Payer: Self-pay | Admitting: Family Medicine

## 2022-06-30 DIAGNOSIS — J310 Chronic rhinitis: Secondary | ICD-10-CM

## 2022-06-30 DIAGNOSIS — J3089 Other allergic rhinitis: Secondary | ICD-10-CM

## 2022-06-30 DIAGNOSIS — J0100 Acute maxillary sinusitis, unspecified: Secondary | ICD-10-CM

## 2022-06-30 NOTE — Telephone Encounter (Signed)
Called pt - LMOMTCB for appointment. 

## 2022-06-30 NOTE — Telephone Encounter (Signed)
Requested medications are due for refill today.  unsure  Requested medications are on the active medications list.  yes  Last refill. 05/05/2022   Future visit scheduled.   no  Notes to clinic.  Seems too soon for refill.    Requested Prescriptions  Pending Prescriptions Disp Refills   fluticasone (FLONASE) 50 MCG/ACT nasal spray [Pharmacy Med Name: FLUTICASONE PROP 50 MCG SPRAY] 48 mL 1    Sig: PLACE 2 SPRAYS INTO BOTH NOSTRILS DAILY. USE FOR 4-6 WEEKS THEN STOP AND USE SEASONALLY OR AS NEEDED     Ear, Nose, and Throat: Nasal Preparations - Corticosteroids Failed - 06/30/2022 12:31 PM      Failed - Valid encounter within last 12 months    Recent Outpatient Visits           11 months ago Acute non-recurrent maxillary sinusitis   [facility] Smitty Cords, DO   1 year ago Annual physical exam   [facility] Smitty Cords, DO   1 year ago Moderate recurrent major depression [facility])   [facility] [place], Netta Neat, DO

## 2022-09-03 ENCOUNTER — Encounter: Payer: Self-pay | Admitting: Family Medicine

## 2022-09-03 ENCOUNTER — Ambulatory Visit (INDEPENDENT_AMBULATORY_CARE_PROVIDER_SITE_OTHER): Payer: 59 | Admitting: Family Medicine

## 2022-09-03 VITALS — BP 126/80 | HR 86 | Ht 70.0 in | Wt 176.0 lb

## 2022-09-03 DIAGNOSIS — E538 Deficiency of other specified B group vitamins: Secondary | ICD-10-CM

## 2022-09-03 DIAGNOSIS — K909 Intestinal malabsorption, unspecified: Secondary | ICD-10-CM | POA: Diagnosis not present

## 2022-09-03 DIAGNOSIS — K9089 Other intestinal malabsorption: Secondary | ICD-10-CM | POA: Diagnosis not present

## 2022-09-03 DIAGNOSIS — K5010 Crohn's disease of large intestine without complications: Secondary | ICD-10-CM

## 2022-09-03 MED ORDER — CYANOCOBALAMIN 1000 MCG/ML IJ SOLN: 1000.0000 ug | INTRAMUSCULAR | 0 refills | Status: DC

## 2022-09-03 NOTE — Patient Instructions (Addendum)
Thank you for coming to the office today.  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 To be scheduled.  Then if doing well, please let me know / message update, and I can order for every 30 days or monthly for 3 more rounds.  Then we repeat the B12 lab.  You are not anemic as well.  Can stop the oral b12 supplement at this point.  Please schedule a Follow-up Appointment to: Return if symptoms worsen or fail to improve.  If you have any other questions or concerns, please feel free to call the office or send a message through [place] You may also schedule an earlier appointment if necessary.  Additionally, you may be receiving a survey about your experience at our office within a few days to 1 week by e-mail or mail. We value your feedback.  ANobie Putnam DO [facility]

## 2022-09-03 NOTE — Progress Notes (Signed)
Subjective:    Patient ID: Cole Everett, male    DOB: [birth year], 37 y.o.   MRN: [id number]  Cole Everett is a 37 y.o. male presenting on 09/03/2022 for Vitamin B 12 Deficiency   HPI  B12 Nutritional Deficiency / Malabsorption, GI Crohns Followed [facility] Dr Comer Locket Recent labs 1 week ago showed low Vitamin B12 209 (08/25/22) Patient is on oral Vitamin B12 supplement without success He admits some fatigue, unsure if attributed to this. He would like to pursue therapy for B12.     09/03/2022   11:22 AM 09/24/2020   10:06 AM 08/13/2020   10:51 AM  Depression screen PHQ 2/9  Decreased Interest 1 0 2  Down, Depressed, Hopeless 1 1 1  $ PHQ - 2 Score 2 1 3  $ Altered sleeping 2 2 1  $ Tired, decreased energy 1 3 3  $ Change in appetite 2 2 2  $ Feeling bad or failure about yourself  1 2 2  $ Trouble concentrating 0 2 0  Moving slowly or fidgety/restless 0 1 1  Suicidal thoughts 0 1 0  PHQ-9 Score 8 14 12  $ Difficult doing work/chores Somewhat difficult Not difficult at all Somewhat difficult    Social History   Tobacco Use   Smoking status: Never   Smokeless tobacco: Never  Vaping Use   Vaping Use: Never used  Substance Use Topics   Alcohol use: Yes    Alcohol/week: 1.0 standard drink of alcohol    Types: 1 Glasses of wine per week    Comment: occasionally   Drug use: Yes    Types: Marijuana    Comment: last use last night    Review of Systems Per HPI unless specifically indicated above     Objective:    BP 126/80   Pulse 86   Ht 5' 10"$  (1.778 m)   Wt 176 lb (79.8 kg)   SpO2 99%   BMI 25.25 kg/m   Wt Readings from Last 3 Encounters:  09/03/22 176 lb (79.8 kg)  07/08/21 170 lb (77.1 kg)  12/31/20 170 lb (77.1 kg)    Physical Exam Vitals and nursing note reviewed.  Constitutional:      General: He is not in acute distress.    Appearance: Normal appearance. He is well-developed. He is not diaphoretic.     Comments: Well-appearing, comfortable, cooperative  HENT:      Head: Normocephalic and atraumatic.  Eyes:     General:        Right eye: No discharge.        Left eye: No discharge.     Conjunctiva/sclera: Conjunctivae normal.  Cardiovascular:     Rate and Rhythm: Normal rate.  Pulmonary:     Effort: Pulmonary effort is normal.  Skin:    General: Skin is warm and dry.     Findings: No erythema or rash.  Neurological:     Mental Status: He is alert and oriented to person, place, and time.  Psychiatric:        Mood and Affect: Mood normal.        Behavior: Behavior normal.        Thought Content: Thought content normal.     Comments: Well groomed, good eye contact, normal speech and thoughts      Results for orders placed or performed during the hospital encounter of 12/31/20  CBC with Differential  Result Value Ref Range   WBC 3.7 (L) 4.0 - 10.5 K/uL   RBC  3.92 (L) 4.22 - 5.81 MIL/uL   Hemoglobin 12.5 (L) 13.0 - 17.0 g/dL   HCT 36.2 (L) 39.0 - 52.0 %   MCV 92.3 80.0 - 100.0 fL   MCH 31.9 26.0 - 34.0 pg   MCHC 34.5 30.0 - 36.0 g/dL   RDW 11.7 11.5 - 15.5 %   Platelets 210 150 - 400 K/uL   nRBC 0.0 0.0 - 0.2 %   Neutrophils Relative % 71 %   Neutro Abs 2.6 1.7 - 7.7 K/uL   Lymphocytes Relative 11 %   Lymphs Abs 0.4 (L) 0.7 - 4.0 K/uL   Monocytes Relative 18 %   Monocytes Absolute 0.7 0.1 - 1.0 K/uL   Eosinophils Relative 0 %   Eosinophils Absolute 0.0 0.0 - 0.5 K/uL   Basophils Relative 0 %   Basophils Absolute 0.0 0.0 - 0.1 K/uL   Immature Granulocytes 0 %   Abs Immature Granulocytes 0.01 0.00 - 0.07 K/uL  Comprehensive metabolic panel  Result Value Ref Range   Sodium 132 (L) 135 - 145 mmol/L   Potassium 3.1 (L) 3.5 - 5.1 mmol/L   Chloride 102 98 - 111 mmol/L   CO2 24 22 - 32 mmol/L   Glucose, Bld 115 (H) 70 - 99 mg/dL   BUN 11 6 - 20 mg/dL   Creatinine, Ser 0.93 0.61 - 1.24 mg/dL   Calcium 9.4 8.9 - 10.3 mg/dL   Total Protein 7.6 6.5 - 8.1 g/dL   Albumin 4.5 3.5 - 5.0 g/dL   AST 27 15 - 41 U/L   ALT 26 0 - 44 U/L    Alkaline Phosphatase 48 38 - 126 U/L   Total Bilirubin 0.7 0.3 - 1.2 mg/dL   GFR, Estimated >60 >60 mL/min   Anion gap 6 5 - 15  Troponin I (High Sensitivity)  Result Value Ref Range   Troponin I (High Sensitivity) 3 <18 ng/L  Troponin I (High Sensitivity)  Result Value Ref Range   Troponin I (High Sensitivity) <2 <18 ng/L      Assessment & Plan:   Problem List Items Addressed This Visit     Crohn's disease of large intestine without complication (HCC)   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 He can follow up / notify [place] progress, and my plan is to repeat lab Vitamin B12 and follow up in 3-4 months and monitor progress. May need long term B12 injections   Meds ordered this encounter  Medications   cyanocobalamin (VITAMIN B12) 1000 MCG/ML injection    Sig: Inject 1 mL (1,000 mcg total) into the muscle once a week. For 4 weeks. Then will re order for monthly.    Dispense:  4 mL    Refill:  0      Follow up plan: Return if symptoms worsen or fail to improve.    ANobie Putnam [facility] 09/03/2022, 11:53 AM

## 2022-09-08 ENCOUNTER — Ambulatory Visit (INDEPENDENT_AMBULATORY_CARE_PROVIDER_SITE_OTHER): Payer: 59

## 2022-09-08 VITALS — Ht 70.0 in | Wt 176.0 lb

## 2022-09-08 DIAGNOSIS — E538 Deficiency of other specified B group vitamins: Secondary | ICD-10-CM | POA: Diagnosis not present

## 2022-09-08 DIAGNOSIS — K909 Intestinal malabsorption, unspecified: Secondary | ICD-10-CM | POA: Diagnosis not present

## 2022-09-08 MED ORDER — CYANOCOBALAMIN 1000 MCG/ML IJ SOLN
1000.0000 ug | Freq: Once | INTRAMUSCULAR | Status: AC
  Administered 2022-09-08: 1000 ug via INTRAMUSCULAR

## 2022-09-15 ENCOUNTER — Ambulatory Visit (INDEPENDENT_AMBULATORY_CARE_PROVIDER_SITE_OTHER): Payer: 59

## 2022-09-15 DIAGNOSIS — E538 Deficiency of other specified B group vitamins: Secondary | ICD-10-CM | POA: Diagnosis not present

## 2022-09-15 DIAGNOSIS — K909 Intestinal malabsorption, unspecified: Secondary | ICD-10-CM

## 2022-09-15 MED ORDER — CYANOCOBALAMIN 1000 MCG/ML IJ SOLN
1000.0000 ug | Freq: Once | INTRAMUSCULAR | Status: AC
  Administered 2022-09-15: 1000 ug via INTRAMUSCULAR

## 2022-09-22 ENCOUNTER — Ambulatory Visit (INDEPENDENT_AMBULATORY_CARE_PROVIDER_SITE_OTHER): Payer: 59

## 2022-09-22 ENCOUNTER — Ambulatory Visit (INDEPENDENT_AMBULATORY_CARE_PROVIDER_SITE_OTHER): Payer: 59 | Admitting: Internal Medicine

## 2022-09-22 ENCOUNTER — Encounter: Payer: Self-pay | Admitting: Internal Medicine

## 2022-09-22 VITALS — BP 128/72 | HR 87 | Temp 97.3°F | Wt 179.0 lb

## 2022-09-22 VITALS — Wt 176.0 lb

## 2022-09-22 DIAGNOSIS — H6993 Unspecified Eustachian tube disorder, bilateral: Secondary | ICD-10-CM | POA: Diagnosis not present

## 2022-09-22 DIAGNOSIS — K909 Intestinal malabsorption, unspecified: Secondary | ICD-10-CM

## 2022-09-22 DIAGNOSIS — E538 Deficiency of other specified B group vitamins: Secondary | ICD-10-CM | POA: Diagnosis not present

## 2022-09-22 MED ORDER — AZITHROMYCIN 250 MG PO TABS: ORAL_TABLET | ORAL | 0 refills | Status: DC

## 2022-09-22 MED ORDER — CYANOCOBALAMIN 1000 MCG/ML IJ SOLN
1000.0000 ug | Freq: Once | INTRAMUSCULAR | Status: AC
  Administered 2022-09-22: 1000 ug via INTRAMUSCULAR

## 2022-09-22 NOTE — Progress Notes (Signed)
Subjective:    Patient ID: Cole Everett, male    DOB: 29-Dec-1985, 37 y.o.   MRN: [id number]  HPI  Patient presents to clinic today with complaint of right ear ear pain.  This started started a few days ago.  He describes the pain as sore and achy but can be sharp at times. He denies loss of hearing or drainage from that ear. He has noted popping in the left ear. He reports associated nasal congestion and sore throat. He is blowing yellow mucous out of his nose. He denies difficulty swallowing. He has had a slight fever but denies chills or body ache. He has tried Ibuprofen with some relief of symptoms. He has had sick contacts with similar symptoms. He does get some seasonal allergies at times.  Review of Systems  Past Medical History:  Diagnosis Date   Allergy to alpha-gal    Anxiety    Crohn disease ([place])    GERD (gastroesophageal reflux disease)    Hypertension    Lumbar disc disease    Seborrheic dermatitis     Current Outpatient Medications  Medication Sig Dispense Refill   buPROPion (WELLBUTRIN XL) 300 MG 24 hr tablet Take 300 mg by mouth daily.     CIMZIA PREFILLED 2 X 200 MG/ML KIT every 30 (thirty) days. For Crohn's     cloNIDine (CATAPRES) 0.1 MG tablet Take 2 tablets by mouth at bedtime.     cyanocobalamin (VITAMIN B12) 1000 MCG/ML injection Inject 1 mL (1,000 mcg total) into the muscle once a week. For 4 weeks. Then will re order for monthly. 4 mL 0   FLUoxetine (PROZAC) 20 MG capsule Take 20 mg by mouth daily.     No current facility-administered medications for this visit.    Allergies  Allergen Reactions   Beef-Derived Products Anaphylaxis    Tickborne, alpha gal   Penicillins Hives    Family History  Problem Relation Age of Onset   Irritable bowel syndrome Mother    Hypertension Mother    Heart disease Mother    Cirrhosis Father    Liver cancer Father    Thyroid disease Sister    Alzheimer's disease Paternal Grandmother     Social History    Socioeconomic History   Marital status: Married    Spouse name: Not on file   Number of children: Not on file   Years of education: Not on file   Highest education level: Not on file  Occupational History   Not on file  Tobacco Use   Smoking status: Never   Smokeless tobacco: Never  Vaping Use   Vaping Use: Never used  Substance and Sexual Activity   Alcohol use: Yes    Alcohol/week: 1.0 standard drink of alcohol    Types: 1 Glasses of wine per week    Comment: occasionally   Drug use: Yes    Types: Marijuana    Comment: last use last night   Sexual activity: Not on file  Other Topics Concern   Not on file  Social History Narrative   Not on file   Social Determinants of Health   Financial Resource Strain: Not on file  Food Insecurity: Not on file  Transportation Needs: Not on file  Physical Activity: Not on file  Stress: Not on file  Social Connections: Not on file  Intimate Partner Violence: Not on file     Constitutional: Denies fever, malaise, fatigue, headache or abrupt weight changes.  HEENT: Patient reports  ear pain nasal congestion and sore throat.  Denies eye pain, eye redness, ringing in the ears, wax buildup, runny nose, bloody nose. Respiratory: Denies difficulty breathing, shortness of breath, cough or sputum production.   Cardiovascular: Denies chest pain, chest tightness, palpitations or swelling in the hands or feet.  Gastrointestinal: Denies abdominal pain, bloating, constipation, diarrhea or blood in the stool.   No other specific complaints in a complete review of systems (except as listed in HPI above).     Objective:   Physical Exam   BP 128/72 (BP Location: Left Arm, Patient Position: Sitting, Cuff Size: Normal)   Pulse 87   Temp (!) 97.3 F (36.3 C) (Temporal)   Wt 179 lb (81.2 kg)   SpO2 99%   BMI 25.68 kg/m   Wt Readings from Last 3 Encounters:  09/22/22 176 lb (79.8 kg)  09/08/22 176 lb (79.8 kg)  09/03/22 176 lb (79.8 kg)     General: Appears his stated age, well developed, well nourished in NAD. HEENT: Head: normal shape and size, no sinus tenderness noted; Eyes: sclera white, no icterus, conjunctiva pink, PERRLA and EOMs intact; Right Ear: Tm's red and slightly bulging but intact, normal light reflex, positive serous effusion; Throat/Mouth: Teeth present, mucosa pink and moist, + PND, no exudate, lesions or ulcerations noted.  Neck: No adenopathy noted. Cardiovascular: Normal rate and rhythm. S1,S2 noted.  No murmur, rubs or gallops noted.  Pulmonary/Chest: Normal effort and positive vesicular breath sounds. No respiratory distress. No wheezes, rales or ronchi noted.     BMET    Component Value Date/Time   NA 132 (L) 12/31/2020 0435   NA 141 04/17/2012 1835   K 3.1 (L) 12/31/2020 0435   K 3.8 04/17/2012 1835   CL 102 12/31/2020 0435   CL 106 04/17/2012 1835   CO2 24 12/31/2020 0435   CO2 24 04/17/2012 1835   GLUCOSE 115 (H) 12/31/2020 0435   GLUCOSE 114 (H) 04/17/2012 1835   BUN 11 12/31/2020 0435   BUN 13 04/17/2012 1835   CREATININE 0.93 12/31/2020 0435   CREATININE 0.82 09/17/2020 0815   CALCIUM 9.4 12/31/2020 0435   CALCIUM 9.5 04/17/2012 1835   GFRNONAA >60 12/31/2020 0435   GFRNONAA 117 09/17/2020 0815   GFRAA 136 09/17/2020 0815    Lipid Panel     Component Value Date/Time   CHOL 150 09/17/2020 0815   TRIG 44 09/17/2020 0815   HDL 53 09/17/2020 0815   CHOLHDL 2.8 09/17/2020 0815   LDLCALC 84 09/17/2020 0815    CBC    Component Value Date/Time   WBC 3.7 (L) 12/31/2020 0435   RBC 3.92 (L) 12/31/2020 0435   HGB 12.5 (L) 12/31/2020 0435   HGB 13.5 04/17/2012 1835   HCT 36.2 (L) 12/31/2020 0435   HCT 38.3 (L) 04/17/2012 1835   PLT 210 12/31/2020 0435   PLT 231 04/17/2012 1835   MCV 92.3 12/31/2020 0435   MCV 93 04/17/2012 1835   MCH 31.9 12/31/2020 0435   MCHC 34.5 12/31/2020 0435   RDW 11.7 12/31/2020 0435   RDW 12.3 04/17/2012 1835   LYMPHSABS 0.4 (L) 12/31/2020 0435    LYMPHSABS 3.4 04/17/2012 1835   MONOABS 0.7 12/31/2020 0435   MONOABS 0.6 04/17/2012 1835   EOSABS 0.0 12/31/2020 0435   EOSABS 0.0 04/17/2012 1835   BASOSABS 0.0 12/31/2020 0435   BASOSABS 0.0 04/17/2012 1835    Hgb A1C Lab Results  Component Value Date   HGBA1C 5.5 09/17/2020  Assessment & Plan:   ETD, Bilateral:  He has evidence of serous effusions bilaterally but much worse on the right This could be leading into an early ear infection Recommend he use Flonase 2 times daily for 3 days then daily thereafter If symptoms persist or worsen, recommend Rx for Azithromycin 250 mg x 5 days  Follow-up with your PCP as previously scheduled Webb Silversmith, NP

## 2022-09-22 NOTE — Patient Instructions (Signed)
Eustachian Tube Dysfunction  Eustachian tube dysfunction refers to a condition in which a blockage develops in the narrow passage that connects the middle ear to the back of the nose (eustachian tube). The eustachian tube regulates air pressure in the middle ear by letting air move between the ear and nose. It also helps to drain fluid from the middle ear space. Eustachian tube dysfunction can affect one or both ears. When the eustachian tube does not function properly, air pressure, fluid, or both can build up in the middle ear. What are the causes? This condition occurs when the eustachian tube becomes blocked or cannot open normally. Common causes of this condition include: Ear infections. Colds and other infections that affect the nose, mouth, and throat (upper respiratory tract). Allergies. Irritation from cigarette smoke. Irritation from stomach acid coming up into the esophagus (gastroesophageal reflux). The esophagus is the part of the body that moves food from the mouth to the stomach. Sudden changes in air pressure, such as from descending in an airplane or scuba diving. Abnormal growths in the nose or throat, such as: Growths that line the nose (nasal polyps). Abnormal growth of cells (tumors). Enlarged tissue at the back of the throat (adenoids). What increases the risk? You are more likely to develop this condition if: You smoke. You are overweight. You are a child who has: Certain birth defects of the mouth, such as cleft palate. Large tonsils or adenoids. What are the signs or symptoms? Common symptoms of this condition include: A feeling of fullness in the ear. Ear pain. Clicking or popping noises in the ear. Ringing in the ear (tinnitus). Hearing loss. Loss of balance. Dizziness. Symptoms may get worse when the air pressure around you changes, such as when you travel to an area of high elevation, fly on an airplane, or go scuba diving. How is this diagnosed? This  condition may be diagnosed based on: Your symptoms. A physical exam of your ears, nose, and throat. Tests, such as those that measure: The movement of your eardrum. Your hearing (audiometry). How is this treated? Treatment depends on the cause and severity of your condition. In mild cases, you may relieve your symptoms by moving air into your ears. This is called "popping the ears." In more severe cases, or if you have symptoms of fluid in your ears, treatment may include: Medicines to relieve congestion (decongestants). Medicines that treat allergies (antihistamines). Nasal sprays or ear drops that contain medicines that reduce swelling (steroids). A procedure to drain the fluid in your eardrum. In this procedure, a small tube may be placed in the eardrum to: Drain the fluid. Restore the air in the middle ear space. A procedure to insert a balloon device through the nose to inflate the opening of the eustachian tube (balloon dilation). Follow these instructions at home: Lifestyle Do not do any of the following until your health care provider approves: Travel to high altitudes. Fly in airplanes. Work in a pressurized cabin or room. Scuba dive. Do not use any products that contain nicotine or tobacco. These products include cigarettes, chewing tobacco, and vaping devices, such as e-cigarettes. If you need help quitting, ask your health care provider. Keep your ears dry. Wear fitted earplugs during showering and bathing. Dry your ears completely after. General instructions Take over-the-counter and prescription medicines only as told by your health care provider. Use techniques to help pop your ears as recommended by your health care provider. These may include: Chewing gum. Yawning. Frequent, forceful swallowing.   Closing your mouth, holding your nose closed, and gently blowing as if you are trying to blow air out of your nose. Keep all follow-up visits. This is important. Contact a  health care provider if: Your symptoms do not go away after treatment. Your symptoms come back after treatment. You are unable to pop your ears. You have: A fever. Pain in your ear. Pain in your head or neck. Fluid draining from your ear. Your hearing suddenly changes. You become very dizzy. You lose your balance. Get help right away if: You have a sudden, severe increase in any of your symptoms. Summary Eustachian tube dysfunction refers to a condition in which a blockage develops in the eustachian tube. It can be caused by ear infections, allergies, inhaled irritants, or abnormal growths in the nose or throat. Symptoms may include ear pain or fullness, hearing loss, or ringing in the ears. Mild cases are treated with techniques to unblock the ears, such as yawning or chewing gum. More severe cases are treated with medicines or procedures. This information is not intended to replace advice given to you by your health care provider. Make sure you discuss any questions you have with your health care provider. Document Revised: 09/16/2020 Document Reviewed: 09/16/2020 Elsevier Patient Education  2023 Elsevier Inc.  

## 2022-09-28 ENCOUNTER — Other Ambulatory Visit: Payer: Self-pay | Admitting: Family Medicine

## 2022-09-28 DIAGNOSIS — E538 Deficiency of other specified B group vitamins: Secondary | ICD-10-CM

## 2022-09-29 ENCOUNTER — Ambulatory Visit (INDEPENDENT_AMBULATORY_CARE_PROVIDER_SITE_OTHER): Payer: 59

## 2022-09-29 ENCOUNTER — Ambulatory Visit: Payer: 59

## 2022-09-29 DIAGNOSIS — E538 Deficiency of other specified B group vitamins: Secondary | ICD-10-CM

## 2022-09-29 DIAGNOSIS — K909 Intestinal malabsorption, unspecified: Secondary | ICD-10-CM | POA: Diagnosis not present

## 2022-09-29 MED ORDER — CYANOCOBALAMIN 1000 MCG/ML IJ SOLN
1000.0000 ug | Freq: Once | INTRAMUSCULAR | Status: AC
  Administered 2022-09-29: 1000 ug via INTRAMUSCULAR

## 2022-09-29 NOTE — Telephone Encounter (Signed)
Requested medication (s) are due for refill today - yes  Requested medication (s) are on the active medication list -yes  Future visit scheduled -no  Last refill: 09/03/22 72m  Notes to clinic: fails lab protocol- over 1 year-2022  Requested Prescriptions  Pending Prescriptions Disp Refills   cyanocobalamin (VITAMIN B12) 1000 MCG/ML injection [Pharmacy Med Name: CYANOCOBALAMIN 1,000 MCG/ML VL] 12 mL 1    Sig: Inject 1 mL (1,000 mcg total) into the muscle once a week. For 4 weeks. Then will re order for monthly.     Endocrinology:  Vitamins - Vitamin B12 Failed - 09/28/2022  2:31 PM      Failed - HCT in normal range and within 360 days    HCT  Date Value Ref Range Status  12/31/2020 36.2 (L) 39.0 - 52.0 % Final  04/17/2012 38.3 (L) 40.0 - 52.0 % Final         Failed - HGB in normal range and within 360 days    Hemoglobin  Date Value Ref Range Status  12/31/2020 12.5 (L) 13.0 - 17.0 g/dL Final   HGB  Date Value Ref Range Status  04/17/2012 13.5 13.0 - 18.0 g/dL Final         Failed - B12 Level in normal range and within 360 days    Vitamin B-12  Date Value Ref Range Status  12/24/2020 201 200 - 1,100 pg/mL Final    Comment:    . Please Note: Although the reference range for vitamin B12 is [phone] pg/mL, it has been reported that between 5 and 10% of patients with values between 200 and 400 pg/mL may experience neuropsychiatric and hematologic abnormalities due to occult B12 deficiency; less than 1% of patients with values above 400 pg/mL will have symptoms. 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 For 4 weeks. Then will re order for monthly.     Endocrinology:  Vitamins - Vitamin B12 Failed - 09/28/2022  2:31 PM      Failed - HCT in normal range and within 360 days    HCT  Date Value Ref Range Status  12/31/2020 36.2 (L) 39.0 - 52.0 % Final  04/17/2012 38.3 (L) 40.0 - 52.0 % Final         Failed - HGB in normal range and within 360 days    Hemoglobin  Date Value Ref Range Status  12/31/2020 12.5 (L) 13.0 - 17.0 g/dL Final   HGB  Date Value Ref Range Status  04/17/2012 13.5 13.0 - 18.0 g/dL Final         Failed - B12 Level in normal range and within 360 days    Vitamin B-12  Date Value Ref Range Status  12/24/2020 201 200 - 1,100 pg/mL Final    Comment:    . Please Note: Although the reference range for vitamin B12 is [phone] pg/mL, it has been reported that between 5 and 10% of patients with values between 200 and 400 pg/mL may experience neuropsychiatric and hematologic abnormalities due to occult B12 deficiency; less than 1% of patients with values above 400 pg/mL will have symptoms. 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

## 2022-11-03 ENCOUNTER — Ambulatory Visit (INDEPENDENT_AMBULATORY_CARE_PROVIDER_SITE_OTHER): Payer: 59

## 2022-11-03 DIAGNOSIS — K909 Intestinal malabsorption, unspecified: Secondary | ICD-10-CM

## 2022-11-03 DIAGNOSIS — E538 Deficiency of other specified B group vitamins: Secondary | ICD-10-CM | POA: Diagnosis not present

## 2022-11-03 MED ORDER — CYANOCOBALAMIN 1000 MCG/ML IJ SOLN
1000.0000 ug | Freq: Once | INTRAMUSCULAR | Status: AC
  Administered 2022-11-03: 1000 ug via INTRAMUSCULAR

## 2022-12-03 ENCOUNTER — Other Ambulatory Visit: Payer: Self-pay | Admitting: Family Medicine

## 2022-12-03 ENCOUNTER — Ambulatory Visit (INDEPENDENT_AMBULATORY_CARE_PROVIDER_SITE_OTHER): Payer: 59

## 2022-12-03 DIAGNOSIS — K909 Intestinal malabsorption, unspecified: Secondary | ICD-10-CM | POA: Diagnosis not present

## 2022-12-03 DIAGNOSIS — E538 Deficiency of other specified B group vitamins: Secondary | ICD-10-CM

## 2022-12-03 MED ORDER — CYANOCOBALAMIN 1000 MCG/ML IJ SOLN
1000.0000 ug | Freq: Once | INTRAMUSCULAR | Status: AC
  Administered 2022-12-03: 1000 ug via INTRAMUSCULAR

## 2022-12-03 NOTE — Progress Notes (Signed)
Patient seen today for B12 injection.  Patient tolerated well.  He questions when he should have blood work done.  He still has a vial of medication on hand.  He would also consider giving himself the injection.

## 2022-12-24 ENCOUNTER — Encounter: Payer: Self-pay | Admitting: Family Medicine

## 2022-12-24 NOTE — Telephone Encounter (Signed)
I called and spoke with the patient and scheduled his B12 injection for 12/31/2022. The pt was also notified that his B12 lab will be in mid July.

## 2022-12-31 ENCOUNTER — Ambulatory Visit (INDEPENDENT_AMBULATORY_CARE_PROVIDER_SITE_OTHER): Payer: 59

## 2022-12-31 DIAGNOSIS — K909 Intestinal malabsorption, unspecified: Secondary | ICD-10-CM | POA: Diagnosis not present

## 2022-12-31 DIAGNOSIS — E538 Deficiency of other specified B group vitamins: Secondary | ICD-10-CM

## 2022-12-31 MED ORDER — CYANOCOBALAMIN 1000 MCG/ML IJ SOLN
1000.0000 ug | Freq: Once | INTRAMUSCULAR | Status: AC
  Administered 2022-12-31: 1000 ug via INTRAMUSCULAR

## 2023-01-29 ENCOUNTER — Ambulatory Visit (INDEPENDENT_AMBULATORY_CARE_PROVIDER_SITE_OTHER): Payer: 59

## 2023-01-29 ENCOUNTER — Other Ambulatory Visit: Payer: 59

## 2023-01-29 DIAGNOSIS — K909 Intestinal malabsorption, unspecified: Secondary | ICD-10-CM | POA: Diagnosis not present

## 2023-01-29 DIAGNOSIS — E538 Deficiency of other specified B group vitamins: Secondary | ICD-10-CM

## 2023-01-29 MED ORDER — CYANOCOBALAMIN 1000 MCG/ML IJ SOLN
1000.0000 ug | Freq: Once | INTRAMUSCULAR | Status: AC
  Administered 2023-01-29: 1000 ug via INTRAMUSCULAR

## 2023-01-30 LAB — VITAMIN B12: Vitamin B-12: 346 pg/mL (ref 200–1100)

## 2023-02-01 ENCOUNTER — Ambulatory Visit: Payer: 59

## 2023-02-01 ENCOUNTER — Other Ambulatory Visit: Payer: 59

## 2023-03-05 ENCOUNTER — Ambulatory Visit (INDEPENDENT_AMBULATORY_CARE_PROVIDER_SITE_OTHER): Payer: 59

## 2023-03-05 DIAGNOSIS — E538 Deficiency of other specified B group vitamins: Secondary | ICD-10-CM

## 2023-03-05 DIAGNOSIS — K909 Intestinal malabsorption, unspecified: Secondary | ICD-10-CM

## 2023-03-05 MED ORDER — CYANOCOBALAMIN 1000 MCG/ML IJ SOLN
1000.0000 ug | Freq: Once | INTRAMUSCULAR | Status: AC
  Administered 2023-03-05: 1000 ug via INTRAMUSCULAR

## 2023-03-18 ENCOUNTER — Other Ambulatory Visit: Payer: Self-pay | Admitting: Family Medicine

## 2023-03-18 DIAGNOSIS — E538 Deficiency of other specified B group vitamins: Secondary | ICD-10-CM

## 2023-03-18 NOTE — Telephone Encounter (Signed)
Requested medication (s) are due for refill today: Yes  Requested medication (s) are on the active medication list: Yes  Last refill:  09/29/22  Future visit scheduled: No  Notes to clinic:  Unable to refill per protocol due to failed labs, no updated results.      Requested Prescriptions  Pending Prescriptions Disp Refills   cyanocobalamin (VITAMIN B12) 1000 MCG/ML injection [Pharmacy Med Name: CYANOCOBALAMIN 1,000 MCG/ML VL] 3 mL 1    Sig: INJECT 1 ML (1,000 MCG TOTAL) INTO THE MUSCLE EVERY 30 DAYS.     Endocrinology:  Vitamins - Vitamin B12 Failed - 03/18/2023  1:44 AM      Failed - HCT in normal range and within 360 days    HCT  Date Value Ref Range Status  12/31/2020 36.2 (L) 39.0 - 52.0 % Final  04/17/2012 38.3 (L) 40.0 - 52.0 % Final         Failed - HGB in normal range and within 360 days    Hemoglobin  Date Value Ref Range Status  12/31/2020 12.5 (L) 13.0 - 17.0 g/dL Final   HGB  Date Value Ref Range Status  04/17/2012 13.5 13.0 - 18.0 g/dL Final         Passed - B12 Level in normal range and within 360 days    Vitamin B-12  Date Value Ref Range Status  01/29/2023 346 200 - 1,100 pg/mL Final    Comment:    . Please Note: Although the reference range for vitamin B12 is [phone] pg/mL, it has been reported that between 5 and 10% of patients with values between 200 and 400 pg/mL may experience neuropsychiatric and hematologic abnormalities due to occult B12 deficiency; less than 1% of patients with values above 400 pg/mL will have symptoms. 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

## 2023-04-02 ENCOUNTER — Ambulatory Visit: Payer: 59

## 2023-04-26 ENCOUNTER — Other Ambulatory Visit: Payer: Self-pay

## 2023-04-26 DIAGNOSIS — E538 Deficiency of other specified B group vitamins: Secondary | ICD-10-CM

## 2023-04-26 MED ORDER — CYANOCOBALAMIN 1000 MCG/ML IJ SOLN: 1000.0000 ug | INTRAMUSCULAR | 1 refills | Status: DC

## 2023-04-26 NOTE — Telephone Encounter (Signed)
I have re ordered the B12 vial to his pharmacy. His next scheduled B12 injection is 05/03/23  He can pick up the order and keep that injection nurse visit  Saralyn Pilar, DO [facility] 04/26/2023, 6:04 PM

## 2023-04-26 NOTE — Telephone Encounter (Signed)
Good day Medical team,  Reaching out to advise that I have an appointment tomorrow morning though I do not have any more B12 vials to provide the nurse. Could this be refilled please and I presume reschedule this appointment until I get the refilled medication.  Thoughts?   Thanks

## 2023-04-27 ENCOUNTER — Encounter: Payer: Self-pay | Admitting: Family Medicine

## 2023-04-28 ENCOUNTER — Ambulatory Visit (INDEPENDENT_AMBULATORY_CARE_PROVIDER_SITE_OTHER): Payer: 59

## 2023-04-28 DIAGNOSIS — E538 Deficiency of other specified B group vitamins: Secondary | ICD-10-CM | POA: Diagnosis not present

## 2023-04-28 DIAGNOSIS — K909 Intestinal malabsorption, unspecified: Secondary | ICD-10-CM

## 2023-04-28 MED ORDER — CYANOCOBALAMIN 1000 MCG/ML IJ SOLN
1000.0000 ug | Freq: Once | INTRAMUSCULAR | Status: AC
  Administered 2023-04-28: 1000 ug via INTRAMUSCULAR

## 2023-04-28 NOTE — Progress Notes (Signed)
NDC not in [place] [facility] [phone]

## 2023-05-03 ENCOUNTER — Ambulatory Visit: Payer: 59

## 2023-05-26 ENCOUNTER — Ambulatory Visit (INDEPENDENT_AMBULATORY_CARE_PROVIDER_SITE_OTHER): Payer: 59

## 2023-05-26 ENCOUNTER — Telehealth: Payer: Self-pay

## 2023-05-26 DIAGNOSIS — E538 Deficiency of other specified B group vitamins: Secondary | ICD-10-CM | POA: Diagnosis not present

## 2023-05-26 DIAGNOSIS — K909 Intestinal malabsorption, unspecified: Secondary | ICD-10-CM

## 2023-05-26 MED ORDER — CYANOCOBALAMIN 1000 MCG/ML IJ SOLN
1000.0000 ug | Freq: Once | INTRAMUSCULAR | Status: AC
  Administered 2023-05-26: 1000 ug via INTRAMUSCULAR

## 2023-05-26 MED ORDER — "SYRINGE 23G X 1"" 3 ML MISC": 1 refills | Status: AC

## 2023-05-26 NOTE — Telephone Encounter (Signed)
Attempted to call patient. No answer. Left message for him to return call.  PEC you could relay Dr. Enid Skeens message. Thanks.

## 2023-05-26 NOTE — Telephone Encounter (Signed)
Patient seen today for B12 injection.  He would like to know if he can give himself this injection at home?  If so he will need an order for needles and syringe.  He also ask if this can be given in the thigh?

## 2023-05-26 NOTE — Telephone Encounter (Signed)
Please notify patient  Yes if he is comfortable to do B12 injection self administer that is fine.  If he wants to do one more here and demonstrate he is comfortable with it that is fine too.  [facility].  He can use the thigh. That is an appropriate Intramuscular IM site for injection.  Saralyn Pilar, DO [facility] 05/26/2023, 12:00 PM

## 2023-12-30 ENCOUNTER — Other Ambulatory Visit: Payer: Self-pay | Admitting: Family Medicine

## 2023-12-30 DIAGNOSIS — E538 Deficiency of other specified B group vitamins: Secondary | ICD-10-CM

## 2023-12-30 NOTE — Telephone Encounter (Signed)
 Copied from CRM [id number]. Topic: Clinical - Medication Refill >> Dec 30, 2023  8:51 AM Janeecia G wrote: Medication: cyancobalamin B12   Has the patient contacted their pharmacy? Yes (Agent: If no, request that the patient contact the pharmacy for the refill. If patient does not wish to contact the pharmacy document the reason why and proceed with request.) (Agent: If yes, when and what did the pharmacy advise?)  This is the patient's preferred pharmacy:  [id number] [place] [place] - [address] [place] [address] Phone: [phone] Fax: [phone]  Is this the correct pharmacy for this prescription? Yes If no, delete pharmacy and type the correct one.   Has the prescription been filled recently? No  Is the patient out of the medication? Yes  Has the patient been seen for an appointment in the last year OR does the patient have an upcoming appointment? No  Can we respond through MyChart? Yes  Agent: Please be advised that Rx refills may take up to 3 business days. We ask that you follow-up with your pharmacy.

## 2023-12-30 NOTE — Telephone Encounter (Signed)
 Unable to refill per protocol, appointment needed.   Requested Prescriptions  Pending Prescriptions Disp Refills   cyanocobalamin  (VITAMIN B12) 1000 MCG/ML injection 3 mL 1    Sig: Inject 1 mL (1,000 mcg total) into the muscle every 30 (thirty) days.     Endocrinology:  Vitamins - Vitamin B12 Failed - 12/30/2023  4:27 PM      Failed - HCT in normal range and within 360 days    HCT  Date Value Ref Range Status  12/31/2020 36.2 (L) 39.0 - 52.0 % Final  04/17/2012 38.3 (L) 40.0 - 52.0 % Final         Failed - HGB in normal range and within 360 days    Hemoglobin  Date Value Ref Range Status  12/31/2020 12.5 (L) 13.0 - 17.0 g/dL Final   HGB  Date Value Ref Range Status  04/17/2012 13.5 13.0 - 18.0 g/dL Final         Failed - Valid encounter within last 12 months    Recent Outpatient Visits   None            Passed - B12 Level in normal range and within 360 days    Vitamin B-12  Date Value Ref Range Status  01/29/2023 346 200 - 1,100 pg/mL Final    Comment:    . Please Note: Although the reference range for vitamin B12 is [phone] pg/mL, it has been reported that between 5 and 10% of patients with values between 200 and 400 pg/mL may experience neuropsychiatric and hematologic abnormalities due to occult B12 deficiency; less than 1% of patients with values above 400 pg/mL will have symptoms. Aaron Aas

## 2024-01-07 NOTE — Telephone Encounter (Signed)
 I would say he needs his b12 levels checked prior to restarting b12 injections.

## 2024-01-13 ENCOUNTER — Encounter: Payer: Self-pay | Admitting: Family Medicine

## 2024-01-13 ENCOUNTER — Ambulatory Visit (INDEPENDENT_AMBULATORY_CARE_PROVIDER_SITE_OTHER): Admitting: Family Medicine

## 2024-01-13 VITALS — BP 110/64 | HR 86 | Ht 70.0 in | Wt 166.4 lb

## 2024-01-13 DIAGNOSIS — K909 Intestinal malabsorption, unspecified: Secondary | ICD-10-CM

## 2024-01-13 DIAGNOSIS — K5010 Crohn's disease of large intestine without complications: Secondary | ICD-10-CM | POA: Diagnosis not present

## 2024-01-13 DIAGNOSIS — E538 Deficiency of other specified B group vitamins: Secondary | ICD-10-CM | POA: Diagnosis not present

## 2024-01-13 MED ORDER — CYANOCOBALAMIN 1000 MCG/ML IJ SOLN: 1000.0000 ug | INTRAMUSCULAR | 0 refills | Status: DC

## 2024-01-13 NOTE — Progress Notes (Signed)
 Subjective:    Patient ID: Cole Everett, male    DOB: [birth year], 38 y.o.   MRN: [id number]  SABIR CHARTERS is a 38 y.o. male presenting on 01/13/2024 for B12 Deficiency   HPI  Discussed the use of AI scribe software for clinical note transcription with the patient, who gave verbal consent to proceed.  History of Present Illness   TARANCE BALAN is a 38 year old male with Crohn's disease who presents for resumption of B12 injections.  Vitamin b12 deficiency symptoms and management - Chronic History of vitamin B12 deficiency secondary to malabsorption from Crohn's disease - Off B12 injections for approximately 3-4 months due to insurance issues - Return of symptoms associated with B12 deficiency during this period. Feels low energy fatigue - Last B12 level checked one year ago was 346 pg/mL; no recent B12 testing - Last B12 injection administered 3-4 months ago; used remaining refills until depleted - Self-administers B12 injections  Crohn's disease management and medication access - Self-manages Crohn's disease injections - Ongoing insurance coverage issues for Crohn's medication, requiring frequent communication with gastroenterologist - Crohn's medication cost is high per injection         01/13/2024   11:47 AM 09/03/2022   11:22 AM 09/24/2020   10:06 AM  Depression screen PHQ 2/9  Decreased Interest 1 1 0  Down, Depressed, Hopeless 1 1 1   PHQ - 2 Score 2 2 1   Altered sleeping 1 2 2   Tired, decreased energy 2 1 3   Change in appetite 2 2 2   Feeling bad or failure about yourself  1 1 2   Trouble concentrating  0 2  Moving slowly or fidgety/restless 2 0 1  Suicidal thoughts 1 0 1  PHQ-9 Score 11 8 14   Difficult doing work/chores Somewhat difficult Somewhat difficult Not difficult at all       01/13/2024   11:48 AM 09/03/2022   11:23 AM 09/24/2020   10:12 AM 08/13/2020   10:43 AM  GAD 7 : Generalized Anxiety Score  Nervous, Anxious, on Edge 2 3 3 2   Control/stop worrying 1 2  2 2   Worry too much - different things 1 2 2 3   Trouble relaxing 2 1 2 2   Restless 2 2 2 3   Easily annoyed or irritable 1 1 2 1   Afraid - awful might happen 1 1 1 1   Total GAD 7 Score 10 12 14 14   Anxiety Difficulty Somewhat difficult Somewhat difficult Somewhat difficult Somewhat difficult    Social History   Tobacco Use   Smoking status: Never   Smokeless tobacco: Never  Vaping Use   Vaping status: Never Used  Substance Use Topics   Alcohol use: Yes    Alcohol/week: 1.0 standard drink of alcohol    Types: 1 Glasses of wine per week    Comment: occasionally   Drug use: Yes    Types: Marijuana    Comment: last use last night    Review of Systems Per HPI unless specifically indicated above     Objective:    BP 110/64 (BP Location: Right Arm, Patient Position: Sitting, Cuff Size: Normal)   Pulse 86   Ht 5' 10 (1.778 m)   Wt 166 lb 6 oz (75.5 kg)   SpO2 98%   BMI 23.87 kg/m   Wt Readings from Last 3 Encounters:  01/13/24 166 lb 6 oz (75.5 kg)  09/22/22 179 lb (81.2 kg)  09/22/22 176 lb (79.8 kg)  Physical Exam Vitals and nursing note reviewed.  Constitutional:      General: He is not in acute distress.    Appearance: Normal appearance. He is well-developed. He is not diaphoretic.     Comments: Well-appearing, comfortable, cooperative  HENT:     Head: Normocephalic and atraumatic.   Eyes:     General:        Right eye: No discharge.        Left eye: No discharge.     Conjunctiva/sclera: Conjunctivae normal.    Cardiovascular:     Rate and Rhythm: Normal rate.  Pulmonary:     Effort: Pulmonary effort is normal.   Skin:    General: Skin is warm and dry.     Findings: No erythema or rash.   Neurological:     Mental Status: He is alert and oriented to person, place, and time.   Psychiatric:        Mood and Affect: Mood normal.        Behavior: Behavior normal.        Thought Content: Thought content normal.     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 Symptoms recurred after stopping supplementation.  Off injections B12 for past 3-4 months - Order Vitamin B12 injections weekly for 30 days (4 doses). - Self admin, he has syringes - Switch to monthly B12 injections for 6 months after 30 days. - Coordinate via MyChart to adjust prescription to monthly after initial 30 days. - Reassess B12 levels and treatment plan after 6 months.  Crohn's disease Crohn's disease affects B12 absorption. Ongoing insurance issues for medication require coordination with gastroenterologist. - Continue current Crohn's disease management regimen. - Address insurance issues related to Crohn's medication as needed.      Note last visit with me 08/2022. He has been out of network with our office as identified recently. He will work on Therapist, art.   No orders of the defined types were placed in this encounter.   Meds ordered this encounter  Medications   cyanocobalamin  (VITAMIN B12) 1000 MCG/ML injection    Sig: Inject 1 mL (1,000 mcg total) into the muscle once a week. For 4 weeks.    Dispense:  4 mL    Refill:  0    For 30 days, weekly dose, then we will change dose to monthly after 30 days    Follow up plan: Return in about 6 months (around 07/14/2024).   Marsa Officer, DO [facility] 01/13/2024, 11:57  AM

## 2024-01-13 NOTE — Patient Instructions (Addendum)
 Thank you for coming to the office today.  Weekly B12 injections for 30 days or 4 weeks, contact me back on mychart before you run out and we can switch the orders to MONTHLY b12 injections, for up to 6 more months and then likely figure out a follow-up visit and lab.  Please schedule a Follow-up Appointment to: Return in about 6 months (around 07/14/2024).  If you have any other questions or concerns, please feel free to call the office or send a message through MyChart. You may also schedule an earlier appointment if necessary.  Additionally, you may be receiving a survey about your experience at our office within a few days to 1 week by e-mail or mail. We value your feedback.  Marsa Officer, DO [facility], [place]

## 2024-02-02 ENCOUNTER — Other Ambulatory Visit: Payer: Self-pay | Admitting: Family Medicine

## 2024-02-02 DIAGNOSIS — E538 Deficiency of other specified B group vitamins: Secondary | ICD-10-CM

## 2024-02-03 NOTE — Telephone Encounter (Signed)
 Requested medications are due for refill today.  yes  Requested medications are on the active medications list.  yes  Last refill. 01/13/2024 4mL 0 rf  Future visit scheduled.     Notes to clinic.  Per sig pt is to start monthly injections after 4 weeks.    Requested Prescriptions  Pending Prescriptions Disp Refills   cyanocobalamin  (VITAMIN B12) 1000 MCG/ML injection [Pharmacy Med Name: CYANOCOBALAMIN  1,000 MCG/ML VL] 12 mL 1    Sig: Inject 1 mL (1,000 mcg total) into the muscle once a week. For 4 weeks.     Endocrinology:  Vitamins - Vitamin B12 Failed - 02/03/2024  5:08 PM      Failed - HCT in normal range and within 360 days    HCT  Date Value Ref Range Status  12/31/2020 36.2 (L) 39.0 - 52.0 % Final  04/17/2012 38.3 (L) 40.0 - 52.0 % Final         Failed - HGB in normal range and within 360 days    Hemoglobin  Date Value Ref Range Status  12/31/2020 12.5 (L) 13.0 - 17.0 g/dL Final   HGB  Date Value Ref Range Status  04/17/2012 13.5 13.0 - 18.0 g/dL Final         Failed - B12 Level in normal range and within 360 days    Vitamin B-12  Date Value Ref Range Status  01/29/2023 346 200 - 1,100 pg/mL Final    Comment:    . Please Note: Although the reference range for vitamin B12 is 5632285940 pg/mL, it has been reported that between 5 and 10% of patients with values between 200 and 400 pg/mL may experience neuropsychiatric and hematologic abnormalities due to occult B12 deficiency; less than 1% of patients with values above 400 pg/mL will have symptoms. 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

## 2024-02-23 ENCOUNTER — Encounter: Payer: Self-pay | Admitting: Family Medicine

## 2024-02-23 DIAGNOSIS — E538 Deficiency of other specified B group vitamins: Secondary | ICD-10-CM

## 2024-02-23 MED ORDER — CYANOCOBALAMIN 1000 MCG/ML IJ SOLN: 1000.0000 ug | INTRAMUSCULAR | 1 refills | Status: DC

## 2024-06-23 ENCOUNTER — Other Ambulatory Visit: Payer: Self-pay

## 2024-06-23 ENCOUNTER — Encounter: Payer: Self-pay | Admitting: Family Medicine

## 2024-06-23 DIAGNOSIS — E538 Deficiency of other specified B group vitamins: Secondary | ICD-10-CM

## 2024-06-26 ENCOUNTER — Other Ambulatory Visit

## 2024-06-28 ENCOUNTER — Other Ambulatory Visit

## 2024-06-29 LAB — VITAMIN B12: Vitamin B-12: 474 pg/mL (ref 200–1100)

## 2024-07-03 ENCOUNTER — Encounter: Payer: Self-pay | Admitting: Family Medicine

## 2024-07-03 ENCOUNTER — Other Ambulatory Visit: Payer: Self-pay | Admitting: Family Medicine

## 2024-07-03 ENCOUNTER — Ambulatory Visit: Admitting: Family Medicine

## 2024-07-03 VITALS — BP 124/70 | HR 66 | Ht 70.0 in | Wt 172.4 lb

## 2024-07-03 DIAGNOSIS — K5010 Crohn's disease of large intestine without complications: Secondary | ICD-10-CM | POA: Diagnosis not present

## 2024-07-03 DIAGNOSIS — F411 Generalized anxiety disorder: Secondary | ICD-10-CM | POA: Diagnosis not present

## 2024-07-03 DIAGNOSIS — E538 Deficiency of other specified B group vitamins: Secondary | ICD-10-CM | POA: Diagnosis not present

## 2024-07-03 DIAGNOSIS — K909 Intestinal malabsorption, unspecified: Secondary | ICD-10-CM | POA: Diagnosis not present

## 2024-07-03 DIAGNOSIS — F410 Panic disorder [episodic paroxysmal anxiety] without agoraphobia: Secondary | ICD-10-CM | POA: Diagnosis not present

## 2024-07-03 DIAGNOSIS — F3341 Major depressive disorder, recurrent, in partial remission: Secondary | ICD-10-CM

## 2024-07-03 DIAGNOSIS — Z0000 Encounter for general adult medical examination without abnormal findings: Secondary | ICD-10-CM

## 2024-07-03 DIAGNOSIS — E559 Vitamin D deficiency, unspecified: Secondary | ICD-10-CM

## 2024-07-03 DIAGNOSIS — Z789 Other specified health status: Secondary | ICD-10-CM

## 2024-07-03 DIAGNOSIS — Z79899 Other long term (current) drug therapy: Secondary | ICD-10-CM

## 2024-07-03 MED ORDER — CYANOCOBALAMIN 1000 MCG/ML IJ SOLN: 1000.0000 ug | INTRAMUSCULAR | 1 refills | Status: AC

## 2024-07-03 MED ORDER — SYRINGE/NEEDLE (DISP) 25G X 1" 1 ML MISC: 0 refills | Status: AC

## 2024-07-03 NOTE — Progress Notes (Signed)
 Subjective:    Patient ID: Cole Everett, male    DOB: 09/29/1985, 38 y.o.   MRN: [id number]  Cole Everett is a 38 y.o. male presenting on 07/03/2024 for Medical Management of Chronic Issues   HPI  Discussed the use of AI scribe software for clinical note transcription with the patient, who gave verbal consent to proceed.  History of Present Illness   Cole Everett is a 38 year old male with Crohn's disease who presents for follow-up on B12 levels and injections.  Vitamin b12 deficiency and supplementation - Receiving monthly B12 injections for deficiency secondary to malabsorption from Crohn's disease - B12 levels improved from 130s to 474 pg/mL as of five days ago - Symptoms remain unchanged despite improved B12 levels - Consistent injection schedule with occasional minor delays of one to two weeks - Self-administers B12 injections - No adverse effects from injections - Two to three months' supply of B12 injections remaining  Crohn's disease management [facility] managing - Managed with Cimzia injections - B12 supplementation initiated by gastroenterologist due to malabsorption concerns  Major Depression recurrent - Partial remission Generalized Anxiety Disorder Mental health management - Under psychiatric care - Current medications: Wellbutrin XL 300 mg once daily, fluoxetine 20 mg once daily, guanfacine - Stable mental health management  Blood pressure monitoring - History of fluctuating blood pressure readings - Recent blood pressure 124/70 mmHg - Has home blood pressure monitor but has not checked recently - No headaches, lightheadedness, or dizziness  Preventive health and immunization status - Interested in general health check-up including full blood panel, as last comprehensive evaluation was over a year ago - Interested in checking hepatitis B immunity status due to uncertainty about vaccination history         07/03/2024    8:11 AM 01/13/2024   11:47 AM  09/03/2022   11:22 AM  Depression screen PHQ 2/9  Decreased Interest 1 1 1   Down, Depressed, Hopeless 1 1 1   PHQ - 2 Score 2 2 2   Altered sleeping 2 1 2   Tired, decreased energy 2 2 1   Change in appetite 1 2 2   Feeling bad or failure about yourself  1 1 1   Trouble concentrating 1  0  Moving slowly or fidgety/restless 0 2 0  Suicidal thoughts 0 1 0  PHQ-9 Score 9 11  8    Difficult doing work/chores Somewhat difficult Somewhat difficult Somewhat difficult     Data saved with a previous flowsheet row definition       07/03/2024    8:12 AM 01/13/2024   11:48 AM 09/03/2022   11:23 AM 09/24/2020   10:12 AM  GAD 7 : Generalized Anxiety Score  Nervous, Anxious, on Edge 2 2 3 3   Control/stop worrying 1 1 2 2   Worry too much - different things 2 1 2 2   Trouble relaxing 1 2 1 2   Restless 1 2 2 2   Easily annoyed or irritable 1 1 1 2   Afraid - awful might happen 0 1 1 1   Total GAD 7 Score 8 10 12 14   Anxiety Difficulty Somewhat difficult Somewhat difficult Somewhat difficult Somewhat difficult    Current Medications[1]   Social History[2]  Review of Systems Per HPI unless specifically indicated above     Objective:    BP 124/70 (BP Location: Left Arm, Cuff Size: Normal)   Pulse 66   Ht 5' 10 (1.778 m)   Wt 172 lb 6 oz (78.2 kg)  SpO2 98%   BMI 24.73 kg/m   Wt Readings from Last 3 Encounters:  07/03/24 172 lb 6 oz (78.2 kg)  01/13/24 166 lb 6 oz (75.5 kg)  09/22/22 179 lb (81.2 kg)    Physical Exam Vitals and nursing note reviewed.  Constitutional:      General: He is not in acute distress.    Appearance: Normal appearance. He is well-developed. He is not diaphoretic.     Comments: Well-appearing, comfortable, cooperative  HENT:     Head: Normocephalic and atraumatic.  Eyes:     General:        Right eye: No discharge.        Left eye: No discharge.     Conjunctiva/sclera: Conjunctivae normal.  Cardiovascular:     Rate and Rhythm: Normal rate.  Pulmonary:      Effort: Pulmonary effort is normal.  Skin:    General: Skin is warm and dry.     Findings: No erythema or rash.  Neurological:     Mental Status: He is alert and oriented to person, place, and time.  Psychiatric:        Mood and Affect: Mood normal.        Behavior: Behavior normal.        Thought Content: Thought content normal.     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 Current regimen effective and well-tolerated. - Continue monthly B12 injections. - Ordered B12 level check in March. - Ordered needle syringes for B12 injections.  Crohn's Disease Followed by [facility] On Cimzia  Generalized Anxiety Major Depression recurrent in partial remission Followed by Psychiatry On current medication management with improvement Bupropion XL 300mg  daily Fluoxetine 20mg  daily Guanfacine 1mg  daily  General Health Maintenance Discussed hepatitis B vaccination status and options for immunity verification. Reviewed annual physical exam and necessary blood work. - Ordered hepatitis B immunity blood test. - Scheduled annual physical exam in March. - Ordered  comprehensive blood work including chemistry panel, blood counts, vitamin D, A1c, thyroid , and cholesterol.        No orders of the defined types were placed in this encounter.   Meds ordered this encounter  Medications   cyanocobalamin  (VITAMIN B12) 1000 MCG/ML injection    Sig: Inject 1 mL (1,000 mcg total) into the muscle every 30 (thirty) days.    Dispense:  3 mL    Refill:  1    For 3 months, then 1 additional refill for 3 more months, total duration 6 months.   Syringe/Needle, Disp, 25G X 1 1 ML MISC    Sig: Use for B12 injection 1 mL  per dose monthly up to 1 year    Dispense:  12 each    Refill:  0    Follow up plan: Return in about 3 months (around 10/01/2024) for 3 month fasting lab > 1 week later Annual Physical.  Future labs ordered for 09/25/24  CMET CBC A1c TSH Lipid Vitamin D Hep B, B12 add  Marsa Officer, DO [facility] 07/03/2024, 8:11 AM     [1]  Current Outpatient Medications:    buPROPion (WELLBUTRIN XL) 300 MG 24 hr tablet, Take 300 mg by mouth daily., Disp: , Rfl:    CIMZIA PREFILLED 2 X 200 MG/ML KIT, every 30 (thirty) days. For Crohn's, Disp: , Rfl:    FLUoxetine (PROZAC) 20 MG capsule, Take 20 mg by mouth daily., Disp: , Rfl:    guanFACINE (INTUNIV) 1 MG TB24 ER tablet, Take 1 mg by mouth at bedtime., Disp: , Rfl:    Syringe/Needle, Disp, (SYRINGE 3CC/23GX1) 23G X 1 3 ML MISC, Use to inject 1mL B12 into muscle monthly as directed. Self administer into thigh., Disp: 50 each, Rfl: 1   Syringe/Needle, Disp, 25G X 1 1 ML MISC, Use for B12 injection 1 mL per dose monthly up to 1 year, Disp: 12 each, Rfl: 0   cyanocobalamin  (VITAMIN B12) 1000 MCG/ML injection, Inject 1 mL (1,000 mcg total) into the muscle every 30 (thirty) days., Disp: 3 mL, Rfl: 1 [2]  Social History Tobacco Use   Smoking status: Never   Smokeless tobacco: Never  Vaping Use   Vaping status: Never Used  Substance Use Topics    Alcohol use: Yes    Alcohol/week: 1.0 standard drink of alcohol    Types: 1 Glasses of wine per week    Comment: occasionally   Drug use: Yes    Types: Marijuana    Comment: last use last night

## 2024-07-03 NOTE — Patient Instructions (Addendum)
 Thank you for coming to the office today.  Keep B12 injections, new orders sent.  All labs in March  Please schedule a Follow-up Appointment to: Return in about 3 months (around 10/01/2024) for 3 month fasting lab > 1 week later Annual Physical.  If you have any other questions or concerns, please feel free to call the office or send a message through MyChart. You may also schedule an earlier appointment if necessary.  Additionally, you may be receiving a survey about your experience at our office within a few days to 1 week by e-mail or mail. We value your feedback.  Marsa Officer, DO [facility], [place]

## 2024-07-28 ENCOUNTER — Other Ambulatory Visit: Payer: Self-pay | Admitting: Family Medicine

## 2024-07-28 DIAGNOSIS — E538 Deficiency of other specified B group vitamins: Secondary | ICD-10-CM

## 2024-07-31 NOTE — Telephone Encounter (Signed)
 Too soon for refill.  Requested Prescriptions  Pending Prescriptions Disp Refills   cyanocobalamin  (VITAMIN B12) 1000 MCG/ML injection [Pharmacy Med Name: CYANOCOBALAMIN  1,000 MCG/ML VL] 9 mL 1    Sig: INJECT 1 ML (1,000 MCG TOTAL) INTO THE MUSCLE EVERY 30 DAYS.     Endocrinology:  Vitamins - Vitamin B12 Failed - 07/31/2024 11:50 AM      Failed - HCT in normal range and within 360 days    HCT  Date Value Ref Range Status  12/31/2020 36.2 (L) 39.0 - 52.0 % Final  04/17/2012 38.3 (L) 40.0 - 52.0 % Final         Failed - HGB in normal range and within 360 days    Hemoglobin  Date Value Ref Range Status  12/31/2020 12.5 (L) 13.0 - 17.0 g/dL Final   HGB  Date Value Ref Range Status  04/17/2012 13.5 13.0 - 18.0 g/dL Final         Passed - B12 Level in normal range and within 360 days    Vitamin B-12  Date Value Ref Range Status  06/28/2024 474 200 - 1,100 pg/mL Final         Passed - Valid encounter within last 12 months    Recent Outpatient Visits           4 weeks ago 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

## 2024-09-25 ENCOUNTER — Other Ambulatory Visit

## 2024-10-02 ENCOUNTER — Encounter: Admitting: Family Medicine
